# Patient Record
Sex: Female | Born: 1960 | Race: White | Hispanic: No | Marital: Single | Smoking: Never smoker
Health system: Southern US, Community
[De-identification: ages and names within clinical notes are randomized; demographics above are authoritative.]

## PROBLEM LIST (undated history)

## (undated) DIAGNOSIS — L811 Chloasma: Secondary | ICD-10-CM

## (undated) DIAGNOSIS — K5790 Diverticulosis of intestine, part unspecified, without perforation or abscess without bleeding: Secondary | ICD-10-CM

## (undated) DIAGNOSIS — M1990 Unspecified osteoarthritis, unspecified site: Secondary | ICD-10-CM

## (undated) DIAGNOSIS — F32A Depression, unspecified: Secondary | ICD-10-CM

## (undated) DIAGNOSIS — Z8619 Personal history of other infectious and parasitic diseases: Secondary | ICD-10-CM

## (undated) DIAGNOSIS — F329 Major depressive disorder, single episode, unspecified: Secondary | ICD-10-CM

## (undated) DIAGNOSIS — F419 Anxiety disorder, unspecified: Secondary | ICD-10-CM

## (undated) HISTORY — DX: Depression, unspecified: F32.A

## (undated) HISTORY — DX: Anxiety disorder, unspecified: F41.9

## (undated) HISTORY — DX: Diverticulosis of intestine, part unspecified, without perforation or abscess without bleeding: K57.90

## (undated) HISTORY — DX: Chloasma: L81.1

## (undated) HISTORY — DX: Major depressive disorder, single episode, unspecified: F32.9

## (undated) HISTORY — DX: Unspecified osteoarthritis, unspecified site: M19.90

## (undated) HISTORY — DX: Personal history of other infectious and parasitic diseases: Z86.19

---

## 1997-12-22 ENCOUNTER — Other Ambulatory Visit: Admission: RE | Admit: 1997-12-22 | Discharge: 1997-12-22 | Payer: Self-pay | Admitting: Obstetrics and Gynecology

## 2000-01-04 ENCOUNTER — Ambulatory Visit (HOSPITAL_COMMUNITY): Admission: RE | Admit: 2000-01-04 | Discharge: 2000-01-04 | Payer: Self-pay | Admitting: Obstetrics and Gynecology

## 2000-01-04 ENCOUNTER — Encounter: Payer: Self-pay | Admitting: Obstetrics and Gynecology

## 2001-02-27 ENCOUNTER — Other Ambulatory Visit: Admission: RE | Admit: 2001-02-27 | Discharge: 2001-02-27 | Payer: Self-pay | Admitting: *Deleted

## 2002-03-02 ENCOUNTER — Other Ambulatory Visit: Admission: RE | Admit: 2002-03-02 | Discharge: 2002-03-02 | Payer: Self-pay | Admitting: Obstetrics and Gynecology

## 2002-04-09 ENCOUNTER — Ambulatory Visit (HOSPITAL_COMMUNITY): Admission: RE | Admit: 2002-04-09 | Discharge: 2002-04-09 | Payer: Self-pay | Admitting: Obstetrics and Gynecology

## 2002-04-09 ENCOUNTER — Encounter: Payer: Self-pay | Admitting: Obstetrics and Gynecology

## 2003-01-19 ENCOUNTER — Ambulatory Visit (HOSPITAL_COMMUNITY): Admission: RE | Admit: 2003-01-19 | Discharge: 2003-01-19 | Payer: Self-pay | Admitting: Family Medicine

## 2003-03-14 ENCOUNTER — Other Ambulatory Visit: Admission: RE | Admit: 2003-03-14 | Discharge: 2003-03-14 | Payer: Self-pay | Admitting: Obstetrics and Gynecology

## 2004-04-20 ENCOUNTER — Ambulatory Visit (HOSPITAL_COMMUNITY): Admission: RE | Admit: 2004-04-20 | Discharge: 2004-04-20 | Payer: Self-pay | Admitting: Obstetrics and Gynecology

## 2005-04-25 ENCOUNTER — Other Ambulatory Visit: Admission: RE | Admit: 2005-04-25 | Discharge: 2005-04-25 | Payer: Self-pay | Admitting: Obstetrics & Gynecology

## 2005-05-27 ENCOUNTER — Encounter: Admission: RE | Admit: 2005-05-27 | Discharge: 2005-05-27 | Payer: Self-pay | Admitting: Obstetrics and Gynecology

## 2006-11-24 ENCOUNTER — Other Ambulatory Visit: Admission: RE | Admit: 2006-11-24 | Discharge: 2006-11-24 | Payer: Self-pay | Admitting: Obstetrics and Gynecology

## 2008-01-04 ENCOUNTER — Other Ambulatory Visit: Admission: RE | Admit: 2008-01-04 | Discharge: 2008-01-04 | Payer: Self-pay | Admitting: Obstetrics and Gynecology

## 2008-01-20 ENCOUNTER — Other Ambulatory Visit: Admission: RE | Admit: 2008-01-20 | Discharge: 2008-01-20 | Payer: Self-pay | Admitting: Obstetrics & Gynecology

## 2008-01-21 ENCOUNTER — Ambulatory Visit (HOSPITAL_COMMUNITY): Admission: RE | Admit: 2008-01-21 | Discharge: 2008-01-21 | Payer: Self-pay | Admitting: Obstetrics and Gynecology

## 2009-02-17 ENCOUNTER — Ambulatory Visit (HOSPITAL_COMMUNITY): Admission: RE | Admit: 2009-02-17 | Discharge: 2009-02-17 | Payer: Self-pay | Admitting: Obstetrics and Gynecology

## 2010-03-06 ENCOUNTER — Ambulatory Visit (HOSPITAL_COMMUNITY)
Admission: RE | Admit: 2010-03-06 | Discharge: 2010-03-06 | Payer: Self-pay | Source: Home / Self Care | Attending: Obstetrics and Gynecology | Admitting: Obstetrics and Gynecology

## 2010-03-11 DIAGNOSIS — K5790 Diverticulosis of intestine, part unspecified, without perforation or abscess without bleeding: Secondary | ICD-10-CM

## 2010-03-11 HISTORY — DX: Diverticulosis of intestine, part unspecified, without perforation or abscess without bleeding: K57.90

## 2010-09-07 HISTORY — PX: COLONOSCOPY: SHX174

## 2010-09-07 LAB — HM COLONOSCOPY

## 2011-02-18 ENCOUNTER — Other Ambulatory Visit: Payer: Self-pay | Admitting: Obstetrics and Gynecology

## 2011-02-18 DIAGNOSIS — Z1231 Encounter for screening mammogram for malignant neoplasm of breast: Secondary | ICD-10-CM

## 2011-03-12 DIAGNOSIS — M1990 Unspecified osteoarthritis, unspecified site: Secondary | ICD-10-CM

## 2011-03-12 HISTORY — DX: Unspecified osteoarthritis, unspecified site: M19.90

## 2011-03-25 ENCOUNTER — Ambulatory Visit (HOSPITAL_COMMUNITY)
Admission: RE | Admit: 2011-03-25 | Discharge: 2011-03-25 | Disposition: A | Discharge status: Home / Self Care | Payer: Federal, State, Local not specified - PPO | Source: Ambulatory Visit | Attending: Obstetrics and Gynecology | Admitting: Obstetrics and Gynecology

## 2011-03-25 DIAGNOSIS — Z1231 Encounter for screening mammogram for malignant neoplasm of breast: Secondary | ICD-10-CM | POA: Insufficient documentation

## 2011-06-10 HISTORY — PX: OTHER SURGICAL HISTORY: SHX169

## 2011-06-28 LAB — HM PAP SMEAR: HM Pap smear: NEGATIVE

## 2011-10-19 ENCOUNTER — Emergency Department (HOSPITAL_COMMUNITY)
Admission: EM | Admit: 2011-10-19 | Discharge: 2011-10-19 | Disposition: A | Discharge status: Home / Self Care | Payer: No Typology Code available for payment source | Attending: Emergency Medicine | Admitting: Emergency Medicine

## 2011-10-19 ENCOUNTER — Emergency Department (HOSPITAL_COMMUNITY): Payer: No Typology Code available for payment source

## 2011-10-19 ENCOUNTER — Encounter (HOSPITAL_COMMUNITY): Payer: Self-pay | Admitting: *Deleted

## 2011-10-19 DIAGNOSIS — Z23 Encounter for immunization: Secondary | ICD-10-CM | POA: Insufficient documentation

## 2011-10-19 DIAGNOSIS — M542 Cervicalgia: Secondary | ICD-10-CM | POA: Insufficient documentation

## 2011-10-19 DIAGNOSIS — M25519 Pain in unspecified shoulder: Secondary | ICD-10-CM | POA: Insufficient documentation

## 2011-10-19 DIAGNOSIS — R112 Nausea with vomiting, unspecified: Secondary | ICD-10-CM | POA: Insufficient documentation

## 2011-10-19 DIAGNOSIS — Y92410 Unspecified street and highway as the place of occurrence of the external cause: Secondary | ICD-10-CM | POA: Insufficient documentation

## 2011-10-19 DIAGNOSIS — T148XXA Other injury of unspecified body region, initial encounter: Secondary | ICD-10-CM

## 2011-10-19 LAB — POCT PREGNANCY, URINE: Preg Test, Ur: NEGATIVE

## 2011-10-19 MED ORDER — IBUPROFEN 800 MG PO TABS: 800.0000 mg | ORAL_TABLET | Freq: Three times a day (TID) | ORAL | Status: AC

## 2011-10-19 MED ORDER — ONDANSETRON HCL 4 MG/2ML IJ SOLN
4.0000 mg | Freq: Once | INTRAMUSCULAR | Status: AC
  Administered 2011-10-19: 4 mg via INTRAVENOUS
  Filled 2011-10-19: qty 2

## 2011-10-19 MED ORDER — HYDROCODONE-ACETAMINOPHEN 5-500 MG PO TABS: 2.0000 | ORAL_TABLET | Freq: Four times a day (QID) | ORAL | Status: AC | PRN

## 2011-10-19 MED ORDER — CYCLOBENZAPRINE HCL 10 MG PO TABS: 10.0000 mg | ORAL_TABLET | Freq: Two times a day (BID) | ORAL | Status: AC | PRN

## 2011-10-19 MED ORDER — TETANUS-DIPHTH-ACELL PERTUSSIS 5-2.5-18.5 LF-MCG/0.5 IM SUSP
0.5000 mL | Freq: Once | INTRAMUSCULAR | Status: AC
  Administered 2011-10-19: 0.5 mL via INTRAMUSCULAR
  Filled 2011-10-19: qty 0.5

## 2011-10-19 MED ORDER — IOHEXOL 300 MG/ML  SOLN: 100.0000 mL | Freq: Once | INTRAMUSCULAR | Status: DC | PRN

## 2011-10-19 MED ORDER — FENTANYL CITRATE 0.05 MG/ML IJ SOLN
25.0000 ug | Freq: Once | INTRAMUSCULAR | Status: AC
  Administered 2011-10-19: 25 ug via INTRAVENOUS
  Filled 2011-10-19: qty 2

## 2011-10-19 MED ORDER — FENTANYL CITRATE 0.05 MG/ML IJ SOLN
50.0000 ug | Freq: Once | INTRAMUSCULAR | Status: AC
  Administered 2011-10-19: 50 ug via INTRAVENOUS
  Filled 2011-10-19: qty 2

## 2011-10-19 MED ORDER — SODIUM CHLORIDE 0.9 % IV BOLUS (SEPSIS)
1000.0000 mL | Freq: Once | INTRAVENOUS | Status: AC
  Administered 2011-10-19: 1000 mL via INTRAVENOUS

## 2011-10-19 MED ORDER — FENTANYL CITRATE 0.05 MG/ML IJ SOLN: 50.0000 ug | Freq: Once | INTRAMUSCULAR | Status: DC
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
## 2011-10-19 NOTE — ED Notes (Signed)
Returned from xray

## 2011-10-19 NOTE — ED Notes (Signed)
To x-ray

## 2011-10-19 NOTE — ED Notes (Addendum)
Justin Mend (friend) - [phone]. To call when pt is ready for discharge
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
## 2011-10-19 NOTE — ED Notes (Signed)
Dr. Dierdre Highman updated re: need for pain med. And verbal order received ffor fentanyl 50 mcg IV now

## 2012-03-17 ENCOUNTER — Other Ambulatory Visit: Payer: Self-pay | Admitting: Obstetrics and Gynecology

## 2012-03-17 ENCOUNTER — Ambulatory Visit (INDEPENDENT_AMBULATORY_CARE_PROVIDER_SITE_OTHER): Payer: Federal, State, Local not specified - PPO | Admitting: Emergency Medicine

## 2012-03-17 VITALS — BP 110/68 | HR 78 | Temp 98.4°F | Resp 16 | Ht 67.0 in | Wt 140.0 lb

## 2012-03-17 DIAGNOSIS — J029 Acute pharyngitis, unspecified: Secondary | ICD-10-CM

## 2012-03-17 DIAGNOSIS — J018 Other acute sinusitis: Secondary | ICD-10-CM

## 2012-03-17 DIAGNOSIS — Z1231 Encounter for screening mammogram for malignant neoplasm of breast: Secondary | ICD-10-CM

## 2012-03-17 MED ORDER — PSEUDOEPHEDRINE-GUAIFENESIN ER 60-600 MG PO TB12: 1.0000 | ORAL_TABLET | Freq: Two times a day (BID) | ORAL | Status: DC

## 2012-03-17 MED ORDER — AMOXICILLIN-POT CLAVULANATE 875-125 MG PO TABS: 1.0000 | ORAL_TABLET | Freq: Two times a day (BID) | ORAL | Status: DC

## 2012-03-17 NOTE — Progress Notes (Signed)
[facility] [address], [place] [address] [phone]  Date:  03/17/2012   Name:  April Vaughn   DOB:  June 17, 1960   MRN:  [id number]  PCP:  Default, Provider, MD    Chief Complaint: Sore Throat   History of Present Illness:  April Vaughn is a 52 y.o. very pleasant female patient who presents with the following:  Sudden onset of fever and sore throat associated with chills yesterday.  Was in a group of children Friday night.  No nausea or vomiting.  Has some nasal congestion and post nasal drip that is purulent in color.  Non productive occasional cough.  No wheezing or shortness of breath.  There is no problem list on file for this patient.   History reviewed. No pertinent past medical history.  History reviewed. No pertinent past surgical history.  History  Substance Use Topics  . Smoking status: Never Smoker   . Smokeless tobacco: Not on file  . Alcohol Use: Yes    Family History  Problem Relation Age of Onset  . Heart disease Mother     No Known Allergies  Medication list has been reviewed and updated.  Current Outpatient Prescriptions on File Prior to Visit  Medication Sig Dispense Refill  . Pseudoephedrine-Ibuprofen (ADVIL COLD/SINUS PO) Take 1 tablet by mouth every 6 (six) hours as needed. For sinus headache or congestion        Review of Systems:  As per HPI, otherwise negative.    Physical Examination: Filed Vitals:   03/17/12 1036  BP: 110/68  Pulse: 78  Temp: 98.4 F (36.9 C)  Resp: 16   Filed Vitals:   03/17/12 1036  Height: 5\' 7"  (1.702 m)  Weight: 140 lb (63.504 kg)   Body mass index is 21.93 kg/(m^2). Ideal Body Weight: Weight in (lb) to have BMI = 25: 159.3   GEN: WDWN, NAD, Non-toxic, A & O x 3 HEENT: Atraumatic, Normocephalic. Neck supple. No masses, No LAD.  Oropharynx erythematous Ears and Nose: No external deformity.  TM negative.  Green nasal drainage CV: RRR, No M/G/R. No JVD. No thrill. No  extra heart sounds. PULM: CTA B, no wheezes, crackles, rhonchi. No retractions. No resp. distress. No accessory muscle use. ABD: S, NT, ND, +BS. No rebound. No HSM. EXTR: No c/c/e NEURO Normal gait.  PSYCH: Normally interactive. Conversant. Not depressed or anxious appearing.  Calm demeanor.    Assessment and Plan: Sinusitis Pharyngitis augmentin mucinex Follow up as needed  Carmelina Dane, MD

## 2012-03-26 ENCOUNTER — Ambulatory Visit (HOSPITAL_COMMUNITY)
Admission: RE | Admit: 2012-03-26 | Discharge: 2012-03-26 | Disposition: A | Discharge status: Home / Self Care | Payer: Federal, State, Local not specified - PPO | Source: Ambulatory Visit | Attending: Obstetrics and Gynecology | Admitting: Obstetrics and Gynecology

## 2012-03-26 DIAGNOSIS — Z1231 Encounter for screening mammogram for malignant neoplasm of breast: Secondary | ICD-10-CM | POA: Insufficient documentation

## 2012-03-26 LAB — HM MAMMOGRAPHY: HM Mammogram: NEGATIVE

## 2012-06-17 ENCOUNTER — Encounter (HOSPITAL_COMMUNITY): Payer: Self-pay | Admitting: *Deleted

## 2012-06-17 ENCOUNTER — Emergency Department (HOSPITAL_COMMUNITY)
Admission: EM | Admit: 2012-06-17 | Discharge: 2012-06-17 | Disposition: A | Discharge status: Home / Self Care | Payer: Federal, State, Local not specified - PPO | Attending: Emergency Medicine | Admitting: Emergency Medicine

## 2012-06-17 ENCOUNTER — Emergency Department (HOSPITAL_COMMUNITY): Payer: Federal, State, Local not specified - PPO

## 2012-06-17 DIAGNOSIS — R42 Dizziness and giddiness: Secondary | ICD-10-CM | POA: Insufficient documentation

## 2012-06-17 DIAGNOSIS — R209 Unspecified disturbances of skin sensation: Secondary | ICD-10-CM | POA: Insufficient documentation

## 2012-06-17 DIAGNOSIS — R0789 Other chest pain: Secondary | ICD-10-CM | POA: Insufficient documentation

## 2012-06-17 DIAGNOSIS — R079 Chest pain, unspecified: Secondary | ICD-10-CM

## 2012-06-17 LAB — BASIC METABOLIC PANEL
BUN: 15 mg/dL (ref 6–23)
Creatinine, Ser: 0.7 mg/dL (ref 0.50–1.10)
GFR calc non Af Amer: >90 mL/min (ref >90)
Glucose, Bld: 101 mg/dL — ABNORMAL HIGH (ref 70–99)
Potassium: 3.9 mEq/L (ref 3.5–5.1)

## 2012-06-17 LAB — CBC
HCT: 39.6 % (ref 36.0–46.0)
Hemoglobin: 13.5 g/dL (ref 12.0–15.0)
MCHC: 34.1 g/dL (ref 30.0–36.0)
MCV: 93.2 fL (ref 78.0–100.0)
RDW: 13.1 % (ref 11.5–15.5)

## 2012-06-17 LAB — POCT I-STAT TROPONIN I: Troponin i, poc: 0 ng/mL (ref 0.00–0.08)

## 2012-06-17 MED ORDER — ASPIRIN 81 MG PO CHEW
324.0000 mg | CHEWABLE_TABLET | Freq: Once | ORAL | Status: AC
  Administered 2012-06-17: 324 mg via ORAL
  Filled 2012-06-17: qty 4

## 2012-06-17 NOTE — ED Provider Notes (Signed)
History     CSN: [id number]  Arrival date & time 06/17/12  1004   First MD Initiated Contact with Patient 06/17/12 1015      Chief Complaint  Patient presents with  . Shortness of Breath    (Consider location/radiation/quality/duration/timing/severity/associated sxs/prior treatment) HPI April Vaughn is a 52 y.o. female who presents to ED with complaint of Shortness of breath. States she was at work yesterday when developed shortness of breath and pressure between her shoulder blades. States symptoms went away on its own and returned again last night while sitting down watching TV. States again resolved. This morning states woke up with similar shortness of breath and states "right arm feels funny, tingling." States she has no medical problems. She does take hormone replacement therapy. States normal cholesterol, no hx of diabetes or htn, does not smoke. States does have sister who is few years older than her and who has cardiac stents due to CAD. States took 81 mg aspirin yesterday, none today. No recent travel or surgeries. No current chest pain.    History reviewed. No pertinent past medical history.  History reviewed. No pertinent past surgical history.  Family History  Problem Relation Age of Onset  . Heart disease Mother     History  Substance Use Topics  . Smoking status: Never Smoker   . Smokeless tobacco: Not on file  . Alcohol Use: Yes    OB History   Grav Para Term Preterm Abortions TAB SAB Ect Mult Living                  Review of Systems  Constitutional: Negative for fever and chills.  Respiratory: Positive for chest tightness and shortness of breath. Negative for cough and wheezing.   Cardiovascular: Negative.   Gastrointestinal: Negative.   Skin: Negative.   Neurological: Positive for numbness. Negative for weakness.  All other systems reviewed and are negative.    Allergies  Review of patient's allergies indicates no known allergies.  Home  Medications   Current Outpatient Rx  Name  Route  Sig  Dispense  Refill  . amoxicillin-clavulanate (AUGMENTIN) 875-125 MG per tablet   Oral   Take 1 tablet by mouth 2 (two) times daily.   20 tablet   0   . pseudoephedrine-guaifenesin (MUCINEX D) 60-600 MG per tablet   Oral   Take 1 tablet by mouth every 12 (twelve) hours.   18 tablet   0   . Pseudoephedrine-Ibuprofen (ADVIL COLD/SINUS PO)   Oral   Take 1 tablet by mouth every 6 (six) hours as needed. For sinus headache or congestion           BP 123/78  Pulse 89  Temp(Src) 98.8 F (37.1 C)  Resp 18  SpO2 98%  Physical Exam  Nursing note and vitals reviewed. Constitutional: She appears well-developed and well-nourished. No distress.  Eyes: Conjunctivae are normal.  Neck: Neck supple.  Cardiovascular: Normal rate, regular rhythm and normal heart sounds.   Pulmonary/Chest: Effort normal and breath sounds normal. No respiratory distress. She has no wheezes. She has no rales.  Musculoskeletal: She exhibits no edema.  Neurological: She is alert.  Skin: Skin is warm and dry.    ED Course  Procedures (including critical care time)   Date: 06/17/2012  Rate: 70  Rhythm: normal sinus rhythm  QRS Axis: left  Intervals: normal  ST/T Wave abnormalities: normal  Conduction Disutrbances:left anterior fascicular block  Narrative Interpretation:   Old EKG Reviewed: none  available  Results for orders placed during the hospital encounter of 06/17/12  CBC      Result Value Range   WBC 6.4  4.0 - 10.5 K/uL   RBC 4.25  3.87 - 5.11 MIL/uL   Hemoglobin 13.5  12.0 - 15.0 g/dL   HCT 16.1  09.6 - 04.5 %   MCV 93.2  78.0 - 100.0 fL   MCH 31.8  26.0 - 34.0 pg   MCHC 34.1  30.0 - 36.0 g/dL   RDW 40.9  81.1 - 91.4 %   Platelets 329  150 - 400 K/uL  BASIC METABOLIC PANEL      Result Value Range   Sodium 138  135 - 145 mEq/L   Potassium 3.9  3.5 - 5.1 mEq/L   Chloride 103  96 - 112 mEq/L   CO2 24  19 - 32 mEq/L   Glucose, Bld  101 (*) 70 - 99 mg/dL   BUN 15  6 - 23 mg/dL   Creatinine, Ser 7.82  0.50 - 1.10 mg/dL   Calcium 9.3  8.4 - 95.6 mg/dL   GFR calc non Af Amer >90  >90 mL/min   GFR calc Af Amer >90  >90 mL/min  D-DIMER, QUANTITATIVE      Result Value Range   D-Dimer, Quant 0.34  0.00 - 0.48 ug/mL-FEU  POCT I-STAT TROPONIN I      Result Value Range   Troponin i, poc 0.01  0.00 - 0.08 ng/mL   Comment 3           POCT I-STAT TROPONIN I      Result Value Range   Troponin i, poc 0.00  0.00 - 0.08 ng/mL   Comment 3            Dg Chest 2 View  06/17/2012  *RADIOLOGY REPORT*  Clinical Data: Shortness of breath.  CHEST - 2 VIEW  Comparison: Chest x-ray 10/19/2011.  Findings: Lung volumes are normal.  No consolidative airspace disease.  No pleural effusions.  No pneumothorax.  No pulmonary nodule or mass noted.  Pulmonary vasculature and the cardiomediastinal silhouette are within normal limits.  IMPRESSION: 1. No radiographic evidence of acute cardiopulmonary disease.   Original Report Authenticated By: Trudie Reed, M.D.        1. Chest pain       MDM  Pt with two episodes yesterday and one today of shortness of breath, dizziness. Pt in NAD. ECG, troponin x2 negative. CXR negative. D dimer negative. Pt is low risk for CAD. No hx. I spoke with Dr. Sharyn Lull, cardiology, who will follow up with pt in the office tomorrow and set up stress test. Pt agreed with the plan. Currently symptom free. Will d/chome with close follow up.   Filed Vitals:   06/17/12 1023 06/17/12 1115 06/17/12 1343  BP: 123/78  127/75  Pulse: 89 62 58  Temp: 98.8 F (37.1 C)    Resp: 18 12   SpO2: 98% 97% 100%          Hazen Brumett A Vannie Hochstetler, PA-C 06/17/12 2057

## 2012-06-17 NOTE — ED Notes (Signed)
Pt reports sob since yesterday. Feels as if she has to breath rapidly to get sufficient oxygenation. Denies chest pain, reports back and neck pain. Denies n/v, diaphoresis. Family Hx cardiopulmonary issues. Pt appears in NAD, 98% O2 RA. Able to ambulate to room from triage with no worsening sob. Reports it started yesterday while sitting at work, no exertion and happened again last night while lying down, again no exertion.

## 2012-06-18 NOTE — ED Provider Notes (Signed)
  Medical screening examination/treatment/procedure(s) were performed by non-physician practitioner and as supervising physician I was immediately available for consultation/collaboration.    Zita Ozimek, MD 06/18/12 1548 

## 2012-07-01 ENCOUNTER — Encounter: Payer: Self-pay | Admitting: *Deleted

## 2012-07-03 ENCOUNTER — Ambulatory Visit (INDEPENDENT_AMBULATORY_CARE_PROVIDER_SITE_OTHER): Payer: Federal, State, Local not specified - PPO | Admitting: Nurse Practitioner

## 2012-07-03 ENCOUNTER — Encounter: Payer: Self-pay | Admitting: Nurse Practitioner

## 2012-07-03 VITALS — BP 114/60 | HR 72 | Ht 66.0 in | Wt 140.0 lb

## 2012-07-03 DIAGNOSIS — B009 Herpesviral infection, unspecified: Secondary | ICD-10-CM

## 2012-07-03 DIAGNOSIS — R002 Palpitations: Secondary | ICD-10-CM

## 2012-07-03 DIAGNOSIS — A609 Anogenital herpesviral infection, unspecified: Secondary | ICD-10-CM

## 2012-07-03 DIAGNOSIS — Z0000 Encounter for general adult medical examination without abnormal findings: Secondary | ICD-10-CM

## 2012-07-03 DIAGNOSIS — Z780 Asymptomatic menopausal state: Secondary | ICD-10-CM

## 2012-07-03 DIAGNOSIS — Z01419 Encounter for gynecological examination (general) (routine) without abnormal findings: Secondary | ICD-10-CM

## 2012-07-03 LAB — POCT URINALYSIS DIPSTICK
Spec Grav, UA: 1.015
Urobilinogen, UA: NEGATIVE

## 2012-07-03 MED ORDER — PROGESTERONE MICRONIZED 200 MG PO CAPS: 200.0000 mg | ORAL_CAPSULE | Freq: Every day | ORAL | Status: DC

## 2012-07-03 MED ORDER — ESTRADIOL 0.5 MG PO TABS: 0.5000 mg | ORAL_TABLET | Freq: Every day | ORAL | Status: DC

## 2012-07-03 MED ORDER — VALACYCLOVIR HCL 1 G PO TABS: 1000.0000 mg | ORAL_TABLET | ORAL | Status: DC | PRN
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
## 2012-07-03 NOTE — Patient Instructions (Addendum)

## 2012-07-04 LAB — THYROID PANEL WITH TSH: TSH: 0.756 u[IU]/mL (ref 0.350–4.500)

## 2012-07-05 NOTE — Progress Notes (Signed)
Encounter reviewed by Dr. Brook Silva.  

## 2012-07-06 ENCOUNTER — Telehealth: Payer: Self-pay | Admitting: Nurse Practitioner

## 2012-07-06 NOTE — Progress Notes (Signed)
LVM for pt to call back for results.

## 2012-07-06 NOTE — Telephone Encounter (Signed)
LVM (x2) for pt to return my call in regards to results.

## 2012-07-06 NOTE — Telephone Encounter (Signed)
Message copied by Osie Bond on Mon Jul 06, 2012  3:02 PM ------      Message from: Ria Comment R      Created: Mon Jul 06, 2012  9:13 AM       Let patient know that TSH and panel is normal and most likely not the cause of palpitations. ------

## 2012-07-06 NOTE — Telephone Encounter (Signed)
Pt is aware of lab results. Pt states palpitations are occuring again today and would like Rx  For Xanax. Please advise.

## 2012-07-06 NOTE — Telephone Encounter (Signed)
Patient returning Tiffany's call.

## 2012-07-06 NOTE — Telephone Encounter (Signed)
Xanax is not a med we give.  She saw PCP and cardiologist for this problem, so they need to prescribe this med.

## 2012-07-07 NOTE — Telephone Encounter (Signed)
Pt advised to ask PCP for Xanax Rx. Pt agreeable and will make contact with PCP.

## 2012-07-23 ENCOUNTER — Encounter: Payer: Self-pay | Admitting: Internal Medicine

## 2012-07-23 ENCOUNTER — Ambulatory Visit (INDEPENDENT_AMBULATORY_CARE_PROVIDER_SITE_OTHER): Payer: Federal, State, Local not specified - PPO | Admitting: Internal Medicine

## 2012-07-23 ENCOUNTER — Telehealth: Payer: Self-pay | Admitting: *Deleted

## 2012-07-23 VITALS — BP 104/82 | HR 75 | Temp 99.1°F | Ht 66.0 in | Wt 138.6 lb

## 2012-07-23 DIAGNOSIS — F411 Generalized anxiety disorder: Secondary | ICD-10-CM

## 2012-07-23 DIAGNOSIS — F32A Depression, unspecified: Secondary | ICD-10-CM

## 2012-07-23 DIAGNOSIS — F419 Anxiety disorder, unspecified: Secondary | ICD-10-CM

## 2012-07-23 DIAGNOSIS — F329 Major depressive disorder, single episode, unspecified: Secondary | ICD-10-CM

## 2012-07-23 MED ORDER — ALPRAZOLAM 0.5 MG PO TABS: 0.5000 mg | ORAL_TABLET | Freq: Two times a day (BID) | ORAL | Status: DC | PRN

## 2012-07-23 MED ORDER — ESCITALOPRAM OXALATE 10 MG PO TABS: 10.0000 mg | ORAL_TABLET | Freq: Every day | ORAL | Status: DC

## 2012-07-23 NOTE — Telephone Encounter (Signed)
Received call from Kim/pharmacist @ [facility]. She states they received script for alprazolam, but pt has been getting med from Dr. Sharyn Lull & is 15 days early. Last filled for # 60. Does md still want fill...Raechel Chute

## 2012-07-23 NOTE — Progress Notes (Signed)
Subjective:    Patient ID: April Vaughn, female    DOB: 01/12/1961, 52 y.o.   MRN: [id number]  HPI New patient to me today -  planned CPX to establish an August 2014, work in today because of acute issues  Patient presents to the office today complaining of "depression".  Patient states that her friends of 20 years have recommended that she come in for depression and talk about the potential use of an antidepressant medication.  Patient states that she has had a dysphoric mood since January of 2014 when she and her girlfriend broke up.  Patient states that five years ago she also had a severe break-up and the loss of both parents and thinks that she did not appropriately grieve at this time.  Patient began counseling in March with Jonetta Osgood.  She has not discussed antidepressant medication with her therapist as she thinks that she would not be accepting of this.  Patient states that her dysphoric mood is associated with decrease in concentration, difficulty falling asleep and staying asleep, decreased appetite and general irritability.  Patient denies suicidal ideation and homicidal ideations.  She does not have access to firearms at home.    Patient also reports that she has had several episodes of anxiety since January as well.  She thought she was having a heart attack in April 2014, and went to [facility] ED for chest pain.  Chest pain at that time was associated with shortness of breath and numbness of the hands.  Patient was referred to Dr. Sharyn Lull in cards for stress test which was normal.  He diagnosed her at that time with anxiety and prescribed her Xanax.  She has used this as a rescue medication twice.  She feels that it is very helpful.   Past Medical History  Diagnosis Date  . Chloasma   . Diverticulosis 2012  . 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 History of PCR DNA positive for HSV1   . Anxiety   . Depression    Family History  Problem Relation Age of Onset  . Heart  disease Mother   . Hypertension Father   . Heart disease Father   . Kidney failure Father   . Colon cancer Maternal Grandfather   . Heart disease Sister   . Crohn's disease Sister      Review of Systems  Constitutional: Positive for appetite change. Negative for fever, chills and fatigue.  Respiratory: Negative for chest tightness and shortness of breath.   Cardiovascular: Negative for chest pain and palpitations.  Neurological: Negative for weakness and numbness.  Psychiatric/Behavioral: Positive for sleep disturbance, dysphoric mood, decreased concentration and agitation. Negative for suicidal ideas and self-injury. The patient is nervous/anxious.   All other systems reviewed and are negative.       Objective:   Physical Exam  Nursing note and vitals reviewed. Constitutional: She is oriented to person, place, and time. She appears well-developed and well-nourished. No distress.  HENT:  Head: Normocephalic and atraumatic.  Eyes: Conjunctivae are normal. No scleral icterus.  Neck: Normal range of motion. Neck supple.  Cardiovascular: Normal rate, regular rhythm and normal heart sounds.  Exam reveals no gallop and no friction rub.   No murmur heard. Pulmonary/Chest: Effort normal and breath sounds normal. No respiratory distress. She has no wheezes. She has no rales. She exhibits no tenderness.  Musculoskeletal: Normal range of motion.  Neurological: She is alert and oriented to person, place, and time.  Skin:  Skin is warm and dry. She is not diaphoretic.  Psychiatric: Her behavior is normal. Judgment and thought content normal. Cognition and memory are normal.  Mildly anxious mood, normal affect   Filed Vitals:   07/23/12 0915  BP: 104/82  Pulse: 75  Temp: 99.1 F (37.3 C)  TempSrc: Oral  Height: 5\' 6"  (1.676 m)  Weight: 138 lb 9.6 oz (62.869 kg)  SpO2: 98%   Lab Results  Component Value Date   WBC 6.4 06/17/2012   HGB 13.5 06/17/2012   HCT 39.6 06/17/2012   PLT 329  06/17/2012   GLUCOSE 101* 06/17/2012   NA 138 06/17/2012   K 3.9 06/17/2012   CL 103 06/17/2012   CREATININE 0.70 06/17/2012   BUN 15 06/17/2012   CO2 24 06/17/2012   TSH 0.756 07/03/2012      Assessment & Plan:  Patient presents to the office today complaining of depression x 5 months:  1.  Depression:  Risks and benefits of antidepressant use were discussed and patient is amenable to trying antidepressant therapy.  Will prescribe Lexapro 10 mg PO QD.  Side effects were discussed.  Patient given strict return precautions and will return in 6 weeks, or will call sooner if she is experiencing problems.  Continue counseling weekly as ongoing.  2  Anxiety:  Patient to continue using Xanax as rescue medications for panic attacks prn.  Patient to return in 6 weeks for recheck of depression.  Will call sooner if problems.

## 2012-07-23 NOTE — Patient Instructions (Signed)
It was good to see you today. We have reviewed your prior records including labs and tests today Start Lexapro once daily for depression and anxiety symptoms. Okay to use Xanax as needed for panic attacks if needed Your prescription(s) have been submitted to your pharmacy. Please take as directed and contact our office if you believe you are having problem(s) with the medication(s). Please schedule followup in 6 weeks, call sooner if problems. Continue working with [facility] for counseling

## 2012-07-23 NOTE — Telephone Encounter (Signed)
Notified Kim with md response.../lmb 

## 2012-07-23 NOTE — Telephone Encounter (Signed)
Pharmacy does not need to fill now, but keep on file  Dr Sharyn Lull (cards) wrote initial rx and i will provide refills as needed going forward -  Thanks!

## 2012-10-19 ENCOUNTER — Encounter: Payer: Self-pay | Admitting: Internal Medicine

## 2012-10-19 ENCOUNTER — Ambulatory Visit (INDEPENDENT_AMBULATORY_CARE_PROVIDER_SITE_OTHER): Payer: Federal, State, Local not specified - PPO | Admitting: Internal Medicine

## 2012-10-19 VITALS — BP 110/78 | HR 68 | Temp 98.0°F | Wt 139.0 lb

## 2012-10-19 DIAGNOSIS — F329 Major depressive disorder, single episode, unspecified: Secondary | ICD-10-CM

## 2012-10-19 DIAGNOSIS — F32A Depression, unspecified: Secondary | ICD-10-CM

## 2012-10-19 DIAGNOSIS — Z0000 Encounter for general adult medical examination without abnormal findings: Secondary | ICD-10-CM

## 2012-10-19 MED ORDER — ESCITALOPRAM OXALATE 10 MG PO TABS: 10.0000 mg | ORAL_TABLET | Freq: Every day | ORAL | Status: DC

## 2012-10-19 NOTE — Patient Instructions (Signed)
It was good to see you today. We have reviewed your prior records including labs and tests today Health Maintenance reviewed - all recommended immunizations and age-appropriate screenings are up-to-date. Test(s) ordered today. Your results will be released to MyChart (or called to you) after review, usually within 72hours after test completion. If any changes need to be made, you will be notified at that same time. Medications reviewed and updated, no changes recommended at this time. Refill on medication(s) as discussed today. Please schedule followup in 6 months on depression symptoms and medications, call sooner if problems.  Health Maintenance, Females A healthy lifestyle and preventative care can promote health and wellness.  Maintain regular health, dental, and eye exams.  Eat a healthy diet. Foods like vegetables, fruits, whole grains, low-fat dairy products, and lean protein foods contain the nutrients you need without too many calories. Decrease your intake of foods high in solid fats, added sugars, and salt. Get information about a proper diet from your caregiver, if necessary.  Regular physical exercise is one of the most important things you can do for your health. Most adults should get at least 150 minutes of moderate-intensity exercise (any activity that increases your heart rate and causes you to sweat) each week. In addition, most adults need muscle-strengthening exercises on 2 or more days a week.   Maintain a healthy weight. The body mass index (BMI) is a screening tool to identify possible weight problems. It provides an estimate of body fat based on height and weight. Your caregiver can help determine your BMI, and can help you achieve or maintain a healthy weight. For adults 20 years and older:  A BMI below 18.5 is considered underweight.  A BMI of 18.5 to 24.9 is normal.  A BMI of 25 to 29.9 is considered overweight.  A BMI of 30 and above is considered  obese.  Maintain normal blood lipids and cholesterol by exercising and minimizing your intake of saturated fat. Eat a balanced diet with plenty of fruits and vegetables. Blood tests for lipids and cholesterol should begin at age 31 and be repeated every 5 years. If your lipid or cholesterol levels are high, you are over 50, or you are a high risk for heart disease, you may need your cholesterol levels checked more frequently.Ongoing high lipid and cholesterol levels should be treated with medicines if diet and exercise are not effective.  If you smoke, find out from your caregiver how to quit. If you do not use tobacco, do not start.  If you are pregnant, do not drink alcohol. If you are breastfeeding, be very cautious about drinking alcohol. If you are not pregnant and choose to drink alcohol, do not exceed 1 drink per day. One drink is considered to be 12 ounces (355 mL) of beer, 5 ounces (148 mL) of wine, or 1.5 ounces (44 mL) of liquor.  Avoid use of street drugs. Do not share needles with anyone. Ask for help if you need support or instructions about stopping the use of drugs.  High blood pressure causes heart disease and increases the risk of stroke. Blood pressure should be checked at least every 1 to 2 years. Ongoing high blood pressure should be treated with medicines, if weight loss and exercise are not effective.  If you are 30 to 51 years old, ask your caregiver if you should take aspirin to prevent strokes.  Diabetes screening involves taking a blood sample to check your fasting blood sugar level. This should be  done once every 3 years, after age 60, if you are within normal weight and without risk factors for diabetes. Testing should be considered at a younger age or be carried out more frequently if you are overweight and have at least 1 risk factor for diabetes.  Breast cancer screening is essential preventative care for women. You should practice "breast self-awareness." This means  understanding the normal appearance and feel of your breasts and may include breast self-examination. Any changes detected, no matter how small, should be reported to a caregiver. Women in their 41s and 30s should have a clinical breast exam (CBE) by a caregiver as part of a regular health exam every 1 to 3 years. After age 78, women should have a CBE every year. Starting at age 31, women should consider having a mammogram (breast X-ray) every year. Women who have a family history of breast cancer should talk to their caregiver about genetic screening. Women at a high risk of breast cancer should talk to their caregiver about having an MRI and a mammogram every year.  The Pap test is a screening test for cervical cancer. Women should have a Pap test starting at age 52. Between ages 39 and 46, Pap tests should be repeated every 2 years. Beginning at age 71, you should have a Pap test every 3 years as long as the past 3 Pap tests have been normal. If you had a hysterectomy for a problem that was not cancer or a condition that could lead to cancer, then you no longer need Pap tests. If you are between ages 22 and 1, and you have had normal Pap tests going back 10 years, you no longer need Pap tests. If you have had past treatment for cervical cancer or a condition that could lead to cancer, you need Pap tests and screening for cancer for at least 20 years after your treatment. If Pap tests have been discontinued, risk factors (such as a new sexual partner) need to be reassessed to determine if screening should be resumed. Some women have medical problems that increase the chance of getting cervical cancer. In these cases, your caregiver may recommend more frequent screening and Pap tests.  The human papillomavirus (HPV) test is an additional test that may be used for cervical cancer screening. The HPV test looks for the virus that can cause the cell changes on the cervix. The cells collected during the Pap test  can be tested for HPV. The HPV test could be used to screen women aged 53 years and older, and should be used in women of any age who have unclear Pap test results. After the age of 56, women should have HPV testing at the same frequency as a Pap test.  Colorectal cancer can be detected and often prevented. Most routine colorectal cancer screening begins at the age of 76 and continues through age 18. However, your caregiver may recommend screening at an earlier age if you have risk factors for colon cancer. On a yearly basis, your caregiver may provide home test kits to check for hidden blood in the stool. Use of a small camera at the end of a tube, to directly examine the colon (sigmoidoscopy or colonoscopy), can detect the earliest forms of colorectal cancer. Talk to your caregiver about this at age 53, when routine screening begins. Direct examination of the colon should be repeated every 5 to 10 years through age 17, unless early forms of pre-cancerous polyps or small growths are found.  Hepatitis C blood testing is recommended for all people born from [birth year] through 1964 and any individual with known risks for hepatitis C.  Practice safe sex. Use condoms and avoid high-risk sexual practices to reduce the spread of sexually transmitted infections (STIs). Sexually active women aged 77 and younger should be checked for Chlamydia, which is a common sexually transmitted infection. Older women with new or multiple partners should also be tested for Chlamydia. Testing for other STIs is recommended if you are sexually active and at increased risk.  Osteoporosis is a disease in which the bones lose minerals and strength with aging. This can result in serious bone fractures. The risk of osteoporosis can be identified using a bone density scan. Women ages 23 and over and women at risk for fractures or osteoporosis should discuss screening with their caregivers. Ask your caregiver whether you should be taking a  calcium supplement or vitamin D to reduce the rate of osteoporosis.  Menopause can be associated with physical symptoms and risks. Hormone replacement therapy is available to decrease symptoms and risks. You should talk to your caregiver about whether hormone replacement therapy is right for you.  Use sunscreen with a sun protection factor (SPF) of 30 or greater. Apply sunscreen liberally and repeatedly throughout the day. You should seek shade when your shadow is shorter than you. Protect yourself by wearing long sleeves, pants, a wide-brimmed hat, and sunglasses year round, whenever you are outdoors.  Notify your caregiver of new moles or changes in moles, especially if there is a change in shape or color. Also notify your caregiver if a mole is larger than the size of a pencil eraser.  Stay current with your immunizations. Document Released: 09/10/2010 Document Revised: 05/20/2011 Document Reviewed: 09/10/2010 [facility] Patient Information 2014 [place], [place].

## 2012-10-19 NOTE — Assessment & Plan Note (Signed)
07/2012 began low dose Lexapro 10 mg qd for symptoms present >15months Much improved -  Consider wean to 5mg  daily if remains stable also with continue counseling biweekly as ongoing.

## 2012-10-19 NOTE — Progress Notes (Signed)
Subjective:    Patient ID: April Vaughn, female    DOB: [birth year], 52 y.o.   MRN: [id number]  HPI  patient is here today for annual physical. Patient feels well and has no complaints.  Also reviewed chronic medical issues, namely medical treatment of depression and anxiety symptoms  Past Medical History  Diagnosis Date  . Chloasma   . Diverticulosis 2012  . 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 History of PCR DNA positive for HSV1   . Anxiety   . Depression    Family History  Problem Relation Age of Onset  . Heart disease Mother   . Hypertension Father   . Heart disease Father   . Kidney failure Father   . Colon cancer Maternal Grandfather   . Heart disease Sister   . Crohn's disease Sister     History  Substance Use Topics  . Smoking status: Never Smoker   . Smokeless tobacco: Never Used  . Alcohol Use: Yes    Review of Systems  Constitutional: Positive for fatigue. Negative for fever and unexpected weight change.  Respiratory: Negative for cough, chest tightness and shortness of breath.   Cardiovascular: Negative for chest pain, palpitations and leg swelling.  Neurological: Negative for weakness, numbness and headaches.  Psychiatric/Behavioral: Positive for sleep disturbance. Negative for suicidal ideas, self-injury and dysphoric mood. The patient is not nervous/anxious.   All other systems reviewed and are negative.       Objective:   Physical Exam BP 110/78  Pulse 68  Temp(Src) 98 F (36.7 C) (Oral)  Wt 139 lb (63.05 kg)  BMI 22.45 kg/m2  SpO2 96% Wt Readings from Last 3 Encounters:  10/19/12 139 lb (63.05 kg)  07/23/12 138 lb 9.6 oz (62.869 kg)  07/03/12 140 lb (63.504 kg)   Constitutional: She appears well-developed and well-nourished. No distress.  HENT: Head: Normocephalic and atraumatic. Ears: B TMs ok, no erythema or effusion; Nose: Nose normal. Mouth/Throat: Oropharynx is clear and moist. No oropharyngeal exudate.  Eyes: Conjunctivae and EOM  are normal. Pupils are equal, round, and reactive to light. No scleral icterus.  Neck: Normal range of motion. Neck supple. No JVD present. No thyromegaly present.  Cardiovascular: Normal rate, regular rhythm and normal heart sounds.  No murmur heard. No BLE edema. Pulmonary/Chest: Effort normal and breath sounds normal. No respiratory distress. She has no wheezes.  Abdominal: Soft. Bowel sounds are normal. She exhibits no distension. There is no tenderness. no masses Musculoskeletal: Normal range of motion, no joint effusions. No gross deformities Neurological: She is alert and oriented to person, place, and time. No cranial nerve deficit. Coordination, balance, strength, speech and gait are normal.  Skin: Skin is warm and dry. No rash noted. No erythema.  Psychiatric: She has a normal mood and affect. Her behavior is normal. Judgment and thought content normal.   Lab Results  Component Value Date   WBC 6.4 06/17/2012   HGB 13.5 06/17/2012   HCT 39.6 06/17/2012   PLT 329 06/17/2012   GLUCOSE 101* 06/17/2012   NA 138 06/17/2012   K 3.9 06/17/2012   CL 103 06/17/2012   CREATININE 0.70 06/17/2012   BUN 15 06/17/2012   CO2 24 06/17/2012   TSH 0.756 07/03/2012      Assessment & Plan:   CPX/v70.0 - Patient has been counseled on age-appropriate routine health concerns for screening and prevention. These are reviewed and up-to-date. Immunizations are up-to-date or declined. Labs ordered and reviewed.  Also  See problem list. Medications and labs reviewed today.

## 2012-10-21 ENCOUNTER — Ambulatory Visit: Payer: Federal, State, Local not specified - PPO | Admitting: Internal Medicine

## 2012-11-01 ENCOUNTER — Encounter: Payer: Self-pay | Admitting: Nurse Practitioner

## 2012-11-20 ENCOUNTER — Other Ambulatory Visit (INDEPENDENT_AMBULATORY_CARE_PROVIDER_SITE_OTHER): Payer: Federal, State, Local not specified - PPO

## 2012-11-20 DIAGNOSIS — Z0000 Encounter for general adult medical examination without abnormal findings: Secondary | ICD-10-CM

## 2012-11-20 DIAGNOSIS — R7989 Other specified abnormal findings of blood chemistry: Secondary | ICD-10-CM

## 2012-11-20 LAB — LIPID PANEL
HDL: 73.7 mg/dL (ref >39.00)
Triglycerides: 66 mg/dL (ref 0.0–149.0)

## 2012-11-20 LAB — CBC WITH DIFFERENTIAL/PLATELET
Basophils Absolute: 0 10*3/uL (ref 0.0–0.1)
Basophils Relative: 0.7 % (ref 0.0–3.0)
Eosinophils Absolute: 0.2 10*3/uL (ref 0.0–0.7)
Eosinophils Relative: 3.3 % (ref 0.0–5.0)
HCT: 41 % (ref 36.0–46.0)
Hemoglobin: 14 g/dL (ref 12.0–15.0)
Monocytes Absolute: 0.6 10*3/uL (ref 0.1–1.0)
Monocytes Relative: 8.7 % (ref 3.0–12.0)
Neutro Abs: 4.3 10*3/uL (ref 1.4–7.7)
Platelets: 329 10*3/uL (ref 150.0–400.0)
RBC: 4.37 Mil/uL (ref 3.87–5.11)
WBC: 6.9 10*3/uL (ref 4.5–10.5)

## 2012-11-20 LAB — HEPATIC FUNCTION PANEL
ALT: 21 U/L (ref 0–35)
Albumin: 4.2 g/dL (ref 3.5–5.2)
Total Protein: 7.6 g/dL (ref 6.0–8.3)

## 2012-11-20 LAB — URINALYSIS, ROUTINE W REFLEX MICROSCOPIC
Hgb urine dipstick: NEGATIVE
Ketones, ur: NEGATIVE
Leukocytes, UA: NEGATIVE
RBC / HPF: NONE SEEN (ref 0–?)
Specific Gravity, Urine: 1.02 (ref 1.000–1.030)
Urine Glucose: NEGATIVE
Urobilinogen, UA: 0.2 (ref 0.0–1.0)

## 2012-11-20 LAB — TSH: TSH: 0.88 u[IU]/mL (ref 0.35–5.50)

## 2012-11-20 LAB — LDL CHOLESTEROL, DIRECT: Direct LDL: 135.9 mg/dL

## 2012-11-20 LAB — BASIC METABOLIC PANEL
BUN: 12 mg/dL (ref 6–23)
GFR: 89.38 mL/min (ref >60.00)
Potassium: 4.4 mEq/L (ref 3.5–5.1)
Sodium: 137 mEq/L (ref 135–145)

## 2012-11-21 ENCOUNTER — Encounter: Payer: Self-pay | Admitting: Nurse Practitioner

## 2012-11-23 ENCOUNTER — Encounter: Payer: Self-pay | Admitting: Nurse Practitioner

## 2012-11-23 ENCOUNTER — Ambulatory Visit (INDEPENDENT_AMBULATORY_CARE_PROVIDER_SITE_OTHER): Payer: Federal, State, Local not specified - PPO | Admitting: Nurse Practitioner

## 2012-11-23 VITALS — BP 100/66 | HR 64 | Ht 66.0 in | Wt 139.0 lb

## 2012-11-23 DIAGNOSIS — Z113 Encounter for screening for infections with a predominantly sexual mode of transmission: Secondary | ICD-10-CM

## 2012-11-23 NOTE — Patient Instructions (Addendum)
Sexually Transmitted Disease  Sexually transmitted disease (STD) refers to any infection that is passed from person to person during sexual activity. This may happen by way of saliva, semen, blood, vaginal mucus, or urine. Common STDs include:   Gonorrhea.   Chlamydia.   Syphilis.   HIV/AIDS.   Genital herpes.   Hepatitis B and C.   Trichomonas.   Human papillomavirus (HPV).   Pubic lice.  CAUSES   An STD may be spread by bacteria, virus, or parasite. A person can get an STD by:   Sexual intercourse with an infected person.   Sharing sex toys with an infected person.   Sharing needles with an infected person.   Having intimate contact with the genitals, mouth, or rectal areas of an infected person.  SYMPTOMS   Some people may not have any symptoms, but they can still pass the infection to others. Different STDs have different symptoms. Symptoms include:   Painful or bloody urination.   Pain in the pelvis, abdomen, vagina, anus, throat, or eyes.   Skin rash, itching, irritation, growths, or sores (lesions). These usually occur in the genital or anal area.   Abnormal vaginal discharge.   Penile discharge in men.   Soft, flesh-colored skin growths in the genital or anal area.   Fever.   Pain or bleeding during sexual intercourse.   Swollen glands in the groin area.   Yellow skin and eyes (jaundice). This is seen with hepatitis.  DIAGNOSIS   To make a diagnosis, your caregiver may:   Take a medical history.   Perform a physical exam.   Take a specimen (culture) to be examined.   Examine a sample of discharge under a microscope.   Perform blood tests.   Perform a Pap test, if this applies.   Perform a colposcopy.   Perform a laparoscopy.  TREATMENT    Chlamydia, gonorrhea, trichomonas, and syphilis can be cured with antibiotic medicine.   Genital herpes, hepatitis, and HIV can be treated, but not cured, with prescribed medicines. The medicines will lessen the symptoms.   Genital warts  from HPV can be treated with medicine or by freezing, burning (electrocautery), or surgery. Warts may come back.   HPV is a virus and cannot be cured with medicine or surgery.However, abnormal areas may be followed very closely by your caregiver and may be removed from the cervix, vagina, or vulva through office procedures or surgery.  If your diagnosis is confirmed, your recent sexual partners need treatment. This is true even if they are symptom-free or have a negative culture or evaluation. They should not have sex until their caregiver says it is okay.  HOME CARE INSTRUCTIONS   All sexual partners should be informed, tested, and treated for all STDs.   Take your antibiotics as directed. Finish them even if you start to feel better.   Only take over-the-counter or prescription medicines for pain, discomfort, or fever as directed by your caregiver.   Rest.   Eat a balanced diet and drink enough fluids to keep your urine clear or pale yellow.   Do not have sex until treatment is completed and you have followed up with your caregiver. STDs should be checked after treatment.   Keep all follow-up appointments, Pap tests, and blood tests as directed by your caregiver.   Only use latex condoms and water-soluble lubricants during sexual activity. Do not use petroleum jelly or oils.   Avoid alcohol and illegal drugs.   Get vaccinated   for HPV and hepatitis. If you have not received these vaccines in the past, talk to your caregiver about whether one or both might be right for you.   Avoid risky sex practices that can break the skin.  The only way to avoid getting an STD is to avoid all sexual activity.Latex condoms and dental dams (for oral sex) will help lessen the risk of getting an STD, but will not completely eliminate the risk.  SEEK MEDICAL CARE IF:    You have a fever.   You have any new or worsening symptoms.  Document Released: 05/18/2002 Document Revised: 05/20/2011 Document Reviewed:  05/25/2010  [employer] Patient Information 2014 [employer].

## 2012-11-24 ENCOUNTER — Encounter: Payer: Self-pay | Admitting: Nurse Practitioner

## 2012-11-24 LAB — STD PANEL: HIV: NONREACTIVE

## 2012-11-24 NOTE — Progress Notes (Signed)
Subjective:     Patient ID: April Vaughn, female   DOB: Sep 07, 1960, 52 y.o.   MRN: [id number]  HPI  This 52 yo SW Fe presents with history of possible STD exposure.  Her female partner has confessed to being unfaithful and being with female partners as well.  She ended the relationship in February but has recently been told the partner has HPV.  patient comes in today for testing and wants to go ahead and get pap done.  At her [facility] pap was not done as her previous HPV testing was negative.  She has no symptoms of PID or pain.   Review of Systems  Constitutional: Negative for fever, chills and fatigue.  HENT: Negative.   Respiratory: Negative.   Cardiovascular: Negative.   Gastrointestinal: Negative.   Genitourinary: Negative.  Negative for dysuria, urgency, vaginal discharge, vaginal pain, pelvic pain and dyspareunia.  Musculoskeletal: Negative.   Skin: Negative.   Neurological: Negative.   Psychiatric/Behavioral: Negative.        Objective:   Physical Exam  Constitutional: She is oriented to person, place, and time. She appears well-developed and well-nourished.  Abdominal: Soft. She exhibits no distension and no mass. There is no tenderness. There is no rebound and no guarding.  Genitourinary:  Normal vaginal discharge. No pain on bimanual.  Neurological: She is alert and oriented to person, place, and time.  Psychiatric: She has a normal mood and affect. Her behavior is normal. Judgment and thought content normal.       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

## 2012-11-25 ENCOUNTER — Telehealth: Payer: Self-pay | Admitting: Nurse Practitioner

## 2012-11-25 LAB — IPS PAP TEST WITH HPV

## 2012-11-25 NOTE — Telephone Encounter (Signed)
Notes Recorded by Luisa Dago, CMA on 11/25/2012 at 10:23 AM Pt notified of results. Pt voices understanding and is agreeable with plan. Advised HPV still pending.

## 2012-11-25 NOTE — Telephone Encounter (Signed)
Patient calling about some test results from a testing she had done on Monday. She didn't know if they were back yet.

## 2012-11-25 NOTE — Telephone Encounter (Signed)
Per note below patient has been informed of STD results. Awaiting HPV results.

## 2012-11-27 NOTE — Telephone Encounter (Signed)
Patient calling to check status of HPV results.

## 2012-11-27 NOTE — Telephone Encounter (Signed)
Results reviewed by Dr Hyacinth Meeker. Pap normal and HPV negative. Call to patient, VM has number confirmation, LM that "results requested are normal" and can call back on Monday

## 2012-11-29 NOTE — Progress Notes (Signed)
Encounter reviewed by Dr. Brook Silva.  

## 2012-12-10 ENCOUNTER — Encounter: Payer: Self-pay | Admitting: Internal Medicine

## 2012-12-10 ENCOUNTER — Ambulatory Visit (INDEPENDENT_AMBULATORY_CARE_PROVIDER_SITE_OTHER): Payer: Federal, State, Local not specified - PPO | Admitting: Internal Medicine

## 2012-12-10 VITALS — BP 114/80 | HR 67 | Temp 97.0°F | Resp 16 | Ht 66.0 in | Wt 138.0 lb

## 2012-12-10 DIAGNOSIS — J209 Acute bronchitis, unspecified: Secondary | ICD-10-CM

## 2012-12-10 MED ORDER — HYDROCODONE-HOMATROPINE 5-1.5 MG/5ML PO SYRP: 5.0000 mL | ORAL_SOLUTION | Freq: Three times a day (TID) | ORAL | Status: DC | PRN

## 2012-12-10 MED ORDER — CEFUROXIME AXETIL 500 MG PO TABS: 500.0000 mg | ORAL_TABLET | Freq: Two times a day (BID) | ORAL | Status: DC

## 2012-12-10 NOTE — Assessment & Plan Note (Signed)
I will treat the infection with ceftin and will control the cough with hycodan 

## 2012-12-10 NOTE — Progress Notes (Signed)
  Subjective:    Patient ID: April Vaughn, female    DOB: 1960-08-20, 52 y.o.   MRN: [id number]  URI  This is a new problem. The current episode started 1 to 4 weeks ago. The problem has been gradually worsening. The maximum temperature recorded prior to her arrival was 100 - 100.9 F. The fever has been present for 3 to 4 days. Associated symptoms include coughing (productive of yellow phlegm), a plugged ear sensation, rhinorrhea, sinus pain and a sore throat. Pertinent negatives include no abdominal pain, chest pain, congestion, diarrhea, dysuria, ear pain, headaches, joint pain, joint swelling, nausea, neck pain, rash, sneezing, swollen glands, vomiting or wheezing. She has tried decongestant for the symptoms. The treatment provided mild relief.      Review of Systems  Constitutional: Negative.  Negative for fever, chills, diaphoresis, activity change, appetite change and fatigue.  HENT: Positive for sore throat, rhinorrhea and sinus pressure. Negative for ear pain, congestion, facial swelling, sneezing, neck pain and postnasal drip.   Eyes: Negative.   Respiratory: Positive for cough (productive of yellow phlegm). Negative for apnea, choking, chest tightness, shortness of breath, wheezing and stridor.   Cardiovascular: Negative.  Negative for chest pain, palpitations and leg swelling.  Gastrointestinal: Negative.  Negative for nausea, vomiting, abdominal pain and diarrhea.  Endocrine: Negative.   Genitourinary: Negative.  Negative for dysuria.  Musculoskeletal: Negative.  Negative for joint pain.  Skin: Negative.  Negative for rash.  Allergic/Immunologic: Negative.   Neurological: Negative.  Negative for dizziness, tremors, weakness and headaches.  Hematological: Negative.  Negative for adenopathy. Does not bruise/bleed easily.  Psychiatric/Behavioral: Negative.        Objective:   Physical Exam  Vitals reviewed. Constitutional: She is oriented to person, place, and time. She  appears well-developed and well-nourished.  Non-toxic appearance. She does not have a sickly appearance. She does not appear ill. No distress.  HENT:  Right Ear: Hearing, tympanic membrane, external ear and ear canal normal.  Left Ear: Hearing, tympanic membrane, external ear and ear canal normal.  Nose: No mucosal edema or rhinorrhea. Right sinus exhibits maxillary sinus tenderness. Right sinus exhibits no frontal sinus tenderness. Left sinus exhibits maxillary sinus tenderness. Left sinus exhibits no frontal sinus tenderness.  Mouth/Throat: Mucous membranes are normal. Mucous membranes are not pale, not dry and not cyanotic. No oral lesions. No trismus in the jaw. No edematous. Posterior oropharyngeal erythema present. No oropharyngeal exudate, posterior oropharyngeal edema or tonsillar abscesses.  Eyes: Conjunctivae are normal. Right eye exhibits no discharge. Left eye exhibits no discharge. No scleral icterus.  Neck: Normal range of motion. Neck supple. No JVD present. No tracheal deviation present. No thyromegaly present.  Cardiovascular: Normal rate, regular rhythm, normal heart sounds and intact distal pulses.  Exam reveals no gallop and no friction rub.   No murmur heard. Pulmonary/Chest: Effort normal and breath sounds normal. No stridor. No respiratory distress. She has no wheezes. She has no rales. She exhibits no tenderness.  Abdominal: Soft. Bowel sounds are normal. She exhibits no distension and no mass. There is no tenderness. There is no rebound and no guarding.  Musculoskeletal: Normal range of motion. She exhibits no edema and no tenderness.  Lymphadenopathy:    She has no cervical adenopathy.  Neurological: She is oriented to person, place, and time.  Skin: Skin is warm and dry. No rash noted. No erythema. No pallor.          Assessment & Plan:

## 2012-12-10 NOTE — Patient Instructions (Signed)

## 2013-01-12 ENCOUNTER — Encounter: Payer: Self-pay | Admitting: Internal Medicine

## 2013-01-12 DIAGNOSIS — Z0000 Encounter for general adult medical examination without abnormal findings: Secondary | ICD-10-CM

## 2013-01-12 MED ORDER — ESCITALOPRAM OXALATE 10 MG PO TABS: 10.0000 mg | ORAL_TABLET | Freq: Every day | ORAL | Status: DC

## 2013-01-14 ENCOUNTER — Other Ambulatory Visit: Payer: Self-pay

## 2013-03-15 ENCOUNTER — Other Ambulatory Visit: Payer: Self-pay | Admitting: Internal Medicine

## 2013-03-15 MED ORDER — ALPRAZOLAM 0.5 MG PO TABS: 0.5000 mg | ORAL_TABLET | Freq: Two times a day (BID) | ORAL | Status: DC | PRN

## 2013-03-30 ENCOUNTER — Other Ambulatory Visit: Payer: Self-pay

## 2013-03-30 DIAGNOSIS — Z1231 Encounter for screening mammogram for malignant neoplasm of breast: Secondary | ICD-10-CM

## 2013-04-16 ENCOUNTER — Ambulatory Visit: Payer: Federal, State, Local not specified - PPO

## 2013-04-28 ENCOUNTER — Ambulatory Visit
Admission: RE | Admit: 2013-04-28 | Discharge: 2013-04-28 | Disposition: A | Discharge status: Home / Self Care | Payer: Federal, State, Local not specified - PPO | Source: Ambulatory Visit

## 2013-04-28 DIAGNOSIS — Z1231 Encounter for screening mammogram for malignant neoplasm of breast: Secondary | ICD-10-CM

## 2013-06-21 ENCOUNTER — Encounter: Payer: Self-pay | Admitting: Internal Medicine

## 2013-07-06 ENCOUNTER — Ambulatory Visit (INDEPENDENT_AMBULATORY_CARE_PROVIDER_SITE_OTHER): Payer: Federal, State, Local not specified - PPO | Admitting: Nurse Practitioner

## 2013-07-06 ENCOUNTER — Encounter: Payer: Self-pay | Admitting: Nurse Practitioner

## 2013-07-06 VITALS — BP 110/74 | HR 64 | Ht 65.5 in | Wt 139.0 lb

## 2013-07-06 DIAGNOSIS — A609 Anogenital herpesviral infection, unspecified: Secondary | ICD-10-CM

## 2013-07-06 DIAGNOSIS — Z01419 Encounter for gynecological examination (general) (routine) without abnormal findings: Secondary | ICD-10-CM

## 2013-07-06 DIAGNOSIS — Z780 Asymptomatic menopausal state: Secondary | ICD-10-CM

## 2013-07-06 DIAGNOSIS — B009 Herpesviral infection, unspecified: Secondary | ICD-10-CM

## 2013-07-06 DIAGNOSIS — Z0000 Encounter for general adult medical examination without abnormal findings: Secondary | ICD-10-CM

## 2013-07-06 LAB — POCT URINALYSIS DIPSTICK
BILIRUBIN UA: NEGATIVE
GLUCOSE UA: NEGATIVE
Ketones, UA: NEGATIVE
Leukocytes, UA: NEGATIVE
Nitrite, UA: NEGATIVE
Protein, UA: NEGATIVE
RBC UA: NEGATIVE
UROBILINOGEN UA: NEGATIVE
pH, UA: 5

## 2013-07-06 LAB — HEMOGLOBIN, FINGERSTICK: Hemoglobin, fingerstick: 13.6 g/dL (ref 12.0–16.0)

## 2013-07-06 MED ORDER — VALACYCLOVIR HCL 1 G PO TABS: 1000.0000 mg | ORAL_TABLET | ORAL | Status: DC | PRN

## 2013-07-06 MED ORDER — ESTRADIOL 0.5 MG PO TABS: 0.5000 mg | ORAL_TABLET | Freq: Every day | ORAL | Status: DC

## 2013-07-06 MED ORDER — PROGESTERONE MICRONIZED 200 MG PO CAPS: 200.0000 mg | ORAL_CAPSULE | Freq: Every day | ORAL | Status: DC

## 2013-07-06 NOTE — Progress Notes (Signed)
Patient ID: April ChattersLynda S Vaughn, female   DOB: 04-18-1960, 53 y.o.   MRN: [id number] 53 y.o. G0P0 Single Caucasian Fe here for annual exam.  On HRT since 2013.  No vaso symptoms. New female partner for 8 months.  Both have been tested before relationship.  She has noted some seborrheic keratosis on her back and has seen the dermatologist.  For the past 8 months or so has noted a subtle change in the left nipple.  States not as 'perky'. She notes no pain or discharge. Normal mammogram in February.  Patient's last menstrual period was 04/12/2011.          Sexually active: yes  The current method of family planning is none and same sex partner.    Exercising: yes  Home exercise routine includes stretching, walking 6 hrs per week and yoga. Smoker:  no  Health Maintenance: Pap:  11/23/12, WNL, neg HR HPV MMG:  04/28/13, Bi-Rads 1: negative Colonoscopy:  09/07/10, polyp, diverticula, repeat in 5 years TDaP:  10/19/11 Labs:  HB:  13.6  Urine:  Negative   reports that she has never smoked. She has never used smokeless tobacco. She reports that she drinks about 2 ounces of alcohol per week. She reports that she does not use illicit drugs.  Past Medical History  Diagnosis Date  . Chloasma   . Diverticulosis 2012  . 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 History of PCR DNA positive for HSV1   . Anxiety   . Depression     Past Surgical History  Procedure Laterality Date  . Colonoscopy  09/07/2010  . Vericose vein Right 06/2011    Current Outpatient Prescriptions  Medication Sig Dispense Refill  . ALPRAZolam (XANAX) 0.5 MG tablet Take 1 tablet (0.5 mg total) by mouth 2 (two) times daily as needed for sleep or anxiety.  20 tablet  0  . estradiol (ESTRACE) 0.5 MG tablet Take 1 tablet (0.5 mg total) by mouth daily.  90 tablet  3  . progesterone (PROMETRIUM) 200 MG capsule Take 1 capsule (200 mg total) by mouth daily. Pt takes only the 1st-10th of month  30 capsule  3  . valACYclovir (VALTREX) 1000 MG tablet  Take 1 tablet (1,000 mg total) by mouth as needed.  30 tablet  12  . Multiple Vitamin (MULTIVITAMIN) tablet Take 1 tablet by mouth daily.       No current facility-administered medications for this visit.    Family History  Problem Relation Age of Onset  . Heart disease Mother   . Hypertension Father   . Heart disease Father   . Kidney failure Father   . Colon cancer Maternal Grandfather   . Heart disease Sister   . Crohn's disease Sister     ROS:  Pertinent items are noted in HPI.  Otherwise, a comprehensive ROS was negative.  Exam:   BP 110/74  Pulse 64  Ht 5' 5.5" (1.664 m)  Wt 139 lb (63.05 kg)  BMI 22.77 kg/m2  LMP 04/12/2011 Height: 5' 5.5" (166.4 cm)  Ht Readings from Last 3 Encounters:  07/06/13 5' 5.5" (1.664 m)  12/10/12 5\' 6"  (1.676 m)  11/23/12 5\' 6"  (1.676 m)    General appearance: alert, cooperative and appears stated age Head: Normocephalic, without obvious abnormality, atraumatic Neck: no adenopathy, supple, symmetrical, trachea midline and thyroid normal to inspection and palpation Lungs: clear to auscultation bilaterally Breasts: normal appearance, no masses or tenderness, No nipple retraction or dimpling, No nipple  discharge or bleeding Heart: regular rate and rhythm Abdomen: soft, non-tender; no masses,  no organomegaly Extremities: extremities normal, atraumatic, no cyanosis or edema Skin: Skin color, texture, turgor normal. No rashes or lesions Lymph nodes: Cervical, supraclavicular, and axillary nodes normal. No abnormal inguinal nodes palpated Neurologic: Grossly normal   Pelvic: External genitalia:  no lesions              Urethra:  normal appearing urethra with no masses, tenderness or lesions              Bartholin's and Skene's: normal                 Vagina: normal appearing vagina with normal color and discharge, no lesions              Cervix: anteverted              Pap taken: no Bimanual Exam:  Uterus:  normal size, contour,  position, consistency, mobility, non-tender              Adnexa: no mass, fullness, tenderness               Rectovaginal: Confirms               Anus:  normal sphincter tone, no lesions  A:  Well Woman with normal exam  Postmenopausal on HRT since 06/2011  History of anxiety and depression  P:   Reviewed health and wellness pertinent to exam  Pap smear not taken today  Mammogram due 04/2014 - if any other changes will repeat earlier  Refill on Estrace and Prometrium for a year  Counseled with risk of CVA, DVT, cancer, etc  Refill on Valtrex for a year  Counseled on breast self exam, mammography screening, use and side effects of HRT, adequate intake of calcium and vitamin D, diet and exercise return annually or prn  An After Visit Summary was printed and given to the patient.

## 2013-07-06 NOTE — Patient Instructions (Signed)

## 2013-07-08 NOTE — Progress Notes (Signed)
Encounter reviewed by Dr. Janique Hoefer Silva.  

## 2013-10-20 ENCOUNTER — Ambulatory Visit (INDEPENDENT_AMBULATORY_CARE_PROVIDER_SITE_OTHER): Payer: Federal, State, Local not specified - PPO | Admitting: Internal Medicine

## 2013-10-20 ENCOUNTER — Encounter: Payer: Self-pay | Admitting: Internal Medicine

## 2013-10-20 VITALS — BP 109/71 | HR 75 | Temp 98.8°F | Wt 138.2 lb

## 2013-10-20 DIAGNOSIS — R5381 Other malaise: Secondary | ICD-10-CM

## 2013-10-20 DIAGNOSIS — J018 Other acute sinusitis: Secondary | ICD-10-CM

## 2013-10-20 DIAGNOSIS — R5383 Other fatigue: Secondary | ICD-10-CM

## 2013-10-20 MED ORDER — AMOXICILLIN 500 MG PO CAPS: 500.0000 mg | ORAL_CAPSULE | Freq: Three times a day (TID) | ORAL | Status: DC

## 2013-10-20 NOTE — Progress Notes (Signed)
Pre visit review using our clinic review tool, if applicable. No additional management support is needed unless otherwise documented below in the visit note. 

## 2013-10-20 NOTE — Progress Notes (Signed)
   Subjective:    Patient ID: April ChattersLynda S Comley, female    DOB: 31-Oct-1960, 53 y.o.   MRN: [id number]  HPI  She has had significant tiredness over the  last 2 weeks even with adequate rest. She denies snoring or being told she has apnea  As of 10/18/15 she developed a sore throat with head and chest congestion. She's had low grade fever she believes with chills and sweats  She has frontal headache , facial pain , dental pain & sore throat which persist. She has had some discolored nasal secretions & pressure in ears.  She's had some shortness of breath and wheezing.   Review of Systems  She has some myalgias and arthralgias and generally feels weak.  Over the last 2 days she's had some dizziness and nausea as well.  She denies otic discharge or productive cough.  She has no significant extrinsic symptoms.        Objective:   Physical Exam  Pertinent or positive findings include: She does appear fatigued. There is a wax impaction on the right.  General appearance:well nourished; no acute distress or increased work of breathing is present.  No  lymphadenopathy about the head, neck, or axilla noted.  Eyes: No conjunctival inflammation or lid edema is present. There is no scleral icterus. Ears:  External ear exam shows no significant lesions or deformities.  L tympanic membrane intact without bulging, retraction, inflammation or discharge. Nose:  External nasal examination shows no deformity or inflammation. Nasal mucosa are pink and moist without lesions or exudates. No septal dislocation or deviation.No obstruction to airflow.  Oral exam: Dental hygiene is good; lips and gums are healthy appearing.There is no oropharyngeal erythema or exudate noted.  Neck:  No deformities, thyromegaly, masses, or tenderness noted.   Supple with full range of motion without pain.  Heart:  Normal rate and regular rhythm. S1 and S2 normal without gallop, murmur, click, rub or other extra sounds.    Lungs:Chest clear to auscultation; no wheezes, rhonchi,rales ,or rubs present.No increased work of breathing.   Extremities:  No cyanosis, edema, or clubbing  noted Skin: Warm & dry w/o jaundice or tenting.         Assessment & Plan:  #1 rhinosinusitis without significant bronchitis #2 dizziness due to #1 & otic impaction  Plan: Nasal hygiene interventions discussed. See prescription medications

## 2013-10-20 NOTE — Patient Instructions (Signed)
Plain Mucinex (NOT D) for thick secretions ;force NON dairy fluids .   Nasal cleansing in the shower as discussed with lather of mild shampoo.After 10 seconds wash off lather while  exhaling through nostrils. Make sure that all residual soap is removed to prevent irritation.  Flonase OR Nasacort AQ 1 spray in each nostril twice a day as needed. Use the "crossover" technique into opposite nostril spraying toward opposite ear @ 45 degree angle, not straight up into nostril.  Use a Neti pot daily only  as needed for significant sinus congestion; going from open side to congested side . Plain Allegra (NOT D )  160 daily , Loratidine 10 mg , OR Zyrtec 10 mg @ bedtime  as needed for itchy eyes & sneezing.  Please do not use Q-tips as we discussed. Should wax build up occur, please put 2-3 drops of mineral oil in the affected  ear at night to soften the wax .Cover the canal with a  cotton ball to prevent the oil from staining bed linens. In the morning fill the ear canal with hydrogen peroxide & lie in the opposite lateral decubitus position(on the side opposite the affected ear)  for 10-15 minutes. After allowing this period of time for the peroxide to dissolve the wax ;shower and use the thinnest washrag available to wick out the wax. If both ears are involved ; alternate this treatment from ear to ear each night until no wax is found on the washrag. 

## 2013-12-24 ENCOUNTER — Other Ambulatory Visit: Payer: Self-pay

## 2014-01-19 ENCOUNTER — Ambulatory Visit (INDEPENDENT_AMBULATORY_CARE_PROVIDER_SITE_OTHER): Payer: Federal, State, Local not specified - PPO | Admitting: Internal Medicine

## 2014-01-19 ENCOUNTER — Encounter: Payer: Self-pay | Admitting: Internal Medicine

## 2014-01-19 ENCOUNTER — Other Ambulatory Visit (INDEPENDENT_AMBULATORY_CARE_PROVIDER_SITE_OTHER): Payer: Federal, State, Local not specified - PPO

## 2014-01-19 VITALS — BP 108/60 | HR 67 | Temp 98.3°F | Ht 65.5 in | Wt 142.2 lb

## 2014-01-19 DIAGNOSIS — R194 Change in bowel habit: Secondary | ICD-10-CM

## 2014-01-19 DIAGNOSIS — Z0000 Encounter for general adult medical examination without abnormal findings: Secondary | ICD-10-CM

## 2014-01-19 DIAGNOSIS — H6121 Impacted cerumen, right ear: Secondary | ICD-10-CM

## 2014-01-19 DIAGNOSIS — R590 Localized enlarged lymph nodes: Secondary | ICD-10-CM

## 2014-01-19 LAB — CBC WITH DIFFERENTIAL/PLATELET
Basophils Absolute: 0 10*3/uL (ref 0.0–0.1)
Basophils Relative: 0.4 % (ref 0.0–3.0)
EOS PCT: 5.3 % — AB (ref 0.0–5.0)
Eosinophils Absolute: 0.5 10*3/uL (ref 0.0–0.7)
HEMATOCRIT: 42.5 % (ref 36.0–46.0)
HEMOGLOBIN: 14 g/dL (ref 12.0–15.0)
LYMPHS ABS: 2 10*3/uL (ref 0.7–4.0)
Lymphocytes Relative: 23.4 % (ref 12.0–46.0)
MCHC: 33 g/dL (ref 30.0–36.0)
MCV: 94.9 fl (ref 78.0–100.0)
MONO ABS: 0.7 10*3/uL (ref 0.1–1.0)
MONOS PCT: 8.4 % (ref 3.0–12.0)
NEUTROS ABS: 5.4 10*3/uL (ref 1.4–7.7)
Neutrophils Relative %: 62.5 % (ref 43.0–77.0)
Platelets: 377 10*3/uL (ref 150.0–400.0)
RBC: 4.48 Mil/uL (ref 3.87–5.11)
RDW: 13 % (ref 11.5–15.5)
WBC: 8.7 10*3/uL (ref 4.0–10.5)

## 2014-01-19 LAB — TSH: TSH: 0.87 u[IU]/mL (ref 0.35–4.50)

## 2014-01-19 LAB — URINALYSIS, ROUTINE W REFLEX MICROSCOPIC
BILIRUBIN URINE: NEGATIVE
Hgb urine dipstick: NEGATIVE
Ketones, ur: NEGATIVE
LEUKOCYTES UA: NEGATIVE
NITRITE: NEGATIVE
SPECIFIC GRAVITY, URINE: 1.01 (ref 1.000–1.030)
TOTAL PROTEIN, URINE-UPE24: NEGATIVE
Urine Glucose: NEGATIVE
Urobilinogen, UA: 0.2 (ref 0.0–1.0)
pH: 7 (ref 5.0–8.0)

## 2014-01-19 LAB — HEPATIC FUNCTION PANEL
ALT: 19 U/L (ref 0–35)
AST: 19 U/L (ref 0–37)
Albumin: 3.9 g/dL (ref 3.5–5.2)
Alkaline Phosphatase: 48 U/L (ref 39–117)
Bilirubin, Direct: 0.1 mg/dL (ref 0.0–0.3)
Total Bilirubin: 0.6 mg/dL (ref 0.2–1.2)
Total Protein: 8 g/dL (ref 6.0–8.3)

## 2014-01-19 LAB — LIPID PANEL
CHOLESTEROL: 217 mg/dL — AB (ref 0–200)
HDL: 61.6 mg/dL (ref >39.00)
LDL CALC: 129 mg/dL — AB (ref 0–99)
NonHDL: 155.4
Total CHOL/HDL Ratio: 4
Triglycerides: 132 mg/dL (ref 0.0–149.0)
VLDL: 26.4 mg/dL (ref 0.0–40.0)

## 2014-01-19 LAB — BASIC METABOLIC PANEL
BUN: 12 mg/dL (ref 6–23)
CHLORIDE: 103 meq/L (ref 96–112)
CO2: 23 mEq/L (ref 19–32)
Calcium: 9.6 mg/dL (ref 8.4–10.5)
Creatinine, Ser: 0.8 mg/dL (ref 0.4–1.2)
GFR: 83.66 mL/min (ref >60.00)
Glucose, Bld: 93 mg/dL (ref 70–99)
POTASSIUM: 4.5 meq/L (ref 3.5–5.1)
SODIUM: 139 meq/L (ref 135–145)

## 2014-01-19 MED ORDER — METRONIDAZOLE 500 MG PO TABS: 500.0000 mg | ORAL_TABLET | Freq: Three times a day (TID) | ORAL | Status: DC

## 2014-01-19 NOTE — Progress Notes (Signed)
Subjective:    Patient ID: April ChattersLynda S Dinse, female    DOB: 04/20/1960, 53 y.o.   MRN: [id number]  HPI  patient is here today for annual physical. Patient feels well in general - see ROS below for concerns  Also reviewed chronic medical issues and interval medical events  Past Medical History  Diagnosis Date  . Chloasma   . Diverticulosis 2012  . 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 History of PCR DNA positive for HSV1   . Anxiety   . Depression    Family History  Problem Relation Age of Onset  . Coronary artery disease Mother [id number]    CABG, died 5174 due to MI  . Hypertension Father   . Coronary artery disease Father   . Kidney failure Father   . Colon cancer Maternal Grandfather [id number]  . Coronary artery disease Sister [id number]    MI, 7 stents  . Crohn's disease Sister [id number]  . Aneurysm Mother     behind eye  . Fibromyalgia Sister     ?RA vs FM   History  Substance Use Topics  . Smoking status: Never Smoker   . Smokeless tobacco: Never Used  . Alcohol Use: 2.0 oz/week    4 drink(s) per week    Review of Systems  Constitutional: Negative for fatigue and unexpected weight change.  HENT: Negative for dental problem, ear discharge, ear pain, hearing loss and postnasal drip.   Respiratory: Negative for cough, shortness of breath and wheezing.   Cardiovascular: Negative for chest pain, palpitations and leg swelling.  Gastrointestinal: Positive for diarrhea ("loose" bowels since 12/20/13, 2-3 BM/d, soft, not liquid [place] - no blood, no fever, no pain, no weight loss). Negative for nausea, vomiting, abdominal pain, constipation, blood in stool and rectal pain.  Musculoskeletal: Positive for neck pain (on R side - "tender" and "thick" x 4-6 wks). Negative for joint swelling and neck stiffness.  Skin: Positive for wound (L calf bruise following snake bite 06/2013). Negative for rash.  Neurological: Negative for dizziness, weakness, light-headedness and  headaches.  Psychiatric/Behavioral: Negative for dysphoric mood. The patient is not nervous/anxious.   All other systems reviewed and are negative.      Objective:   Physical Exam  BP 108/60 mmHg  Pulse 67  Temp(Src) 98.3 F (36.8 C) (Oral)  Ht 5' 5.5" (1.664 m)  Wt 142 lb 4 oz (64.524 kg)  BMI 23.30 kg/m2  SpO2 97% Wt Readings from Last 3 Encounters:  01/19/14 142 lb 4 oz (64.524 kg)  10/20/13 138 lb 4 oz (62.71 kg)  07/06/13 139 lb (63.05 kg)   Constitutional: She appears well-developed and well-nourished. No distress.  HENT: Head: Normocephalic and atraumatic. Ears: R ear with cerumen impaction - after irrigation, B TMs ok, no erythema or effusion; Nose: Nose normal. Mouth/Throat: Oropharynx is clear and moist. No oropharyngeal exudate.  Eyes: Conjunctivae and EOM are normal. Pupils are equal, round, and reactive to light. No scleral icterus.  Neck: Normal range of motion. Neck supple. Mild R side LAD with tenderness to palp. No JVD present. No thyromegaly present.  Cardiovascular: Normal rate, regular rhythm and normal heart sounds.  No murmur heard. No BLE edema. Pulmonary/Chest: Effort normal and breath sounds normal. No respiratory distress. She has no wheezes.  Abdominal: Soft. Bowel sounds are normal. She exhibits no distension. There is no tenderness. no masses GU/breast: defer to gyn Musculoskeletal: Normal range of motion, no joint  effusions. No gross deformities Neurological: She is alert and oriented to person, place, and time. No cranial nerve deficit. Coordination, balance, strength, speech and gait are normal.  Skin: Skin is warm and dry. No rash noted. No erythema.  Psychiatric: She has a normal mood and affect. Her behavior is normal. Judgment and thought content normal.    Lab Results  Component Value Date   WBC 6.9 11/20/2012   HGB 14.0 11/20/2012   HCT 41.0 11/20/2012   PLT 329.0 11/20/2012   GLUCOSE 86 11/20/2012   CHOL 225* 11/20/2012   TRIG 66.0  11/20/2012   HDL 73.70 11/20/2012   LDLDIRECT 135.9 11/20/2012   ALT 21 11/20/2012   AST 19 11/20/2012   NA 137 11/20/2012   K 4.4 11/20/2012   CL 106 11/20/2012   CREATININE 0.7 11/20/2012   BUN 12 11/20/2012   CO2 26 11/20/2012   TSH 0.88 11/20/2012    Mm Screening Breast Tomo Bilateral  04/28/2013   CLINICAL DATA:  Screening.  EXAM: DIGITAL SCREENING BILATERAL MAMMOGRAM WITH 3D TOMO WITH CAD  COMPARISON:  Previous exam(s).  ACR Breast Density Category b: There are scattered areas of fibroglandular density.  FINDINGS: There are no findings suspicious for malignancy. Images were processed with CAD.  IMPRESSION: No mammographic evidence of malignancy. A result letter of this screening mammogram will be mailed directly to the patient.  RECOMMENDATION: Screening mammogram in one year. (Code:SM-B-01Y)  BI-RADS CATEGORY  1: Negative.   Electronically Signed   By: Baird Lyonsina  Arceo M.D.   On: 04/28/2013 16:31    Procedure: wax removal, R Reason: wax impaction, R Risks and benefits of procedure discussed with the patient who agrees to proceed. Ear irrigated with warm water. Large amount of wax removed. Instrumentation with metal ear loop was performed to accomplish wax removal. the patient tolerated procedure well.     Assessment & Plan:   CPX/z00.00 - Patient has been counseled on age-appropriate routine health concerns for screening and prevention. These are reviewed and up-to-date. Immunizations are up-to-date or declined. Labs ordered and reviewed.  Right neck tenderness with mild shoddy lymphadenopathy. Irrigation of year without apparent problem causing reaction. No apparent dental problem or sinus infection. Will check ultrasound of neck at patient request to rule out underlying anatomic problem.  Change in bowels. Suspect bacterial overgrowth rather than infection given lack of red flags. Also reviewed family history of Crohn's with prior unremarkable colonoscopy. Will treat with 7 days  metronidazole and encourage probiotic use. If continued problems in the next 2-3 weeks patient will call for referral to GI for follow-up as needed, we'll call sooner if worse  Snakebite right calf April 2015 reviewed. Explained natural course of resolution with skin discoloration as expected. No treatment changes recommended  Right side cerumen impaction. Irrigation as above. No apparent complications

## 2014-01-19 NOTE — Patient Instructions (Addendum)
It was good to see you today.  We have reviewed your prior records including labs and tests today  Health Maintenance reviewed - consider your annual flu shot, all other recommended immunizations and age-appropriate screenings are up-to-date.  Test(s) ordered today. Your results will be released to [place] (or called to you) after review, usually within 72hours after test completion. If any changes need to be made, you will be notified at that same time.  Medications reviewed and updated Take metronidazole antibiotics 3 times a day for one week to help reset bowel function - Your prescription(s) have been submitted to your pharmacy. Please take as directed and contact our office if you believe you are having problem(s) with the medication(s).  Also use probiotic daily for next 2 weeks as discussed - no other changes recommended at this time.  Continued bowel problems in the next 2-3 weeks, call for referral to Dr. Collene Mares as needed, please call sooner if worse  we'll make referral For ultrasound of your neck as discussed. Our office will contact you regarding appointment(s) once made.  Your ear has been irrigated of wax today -let us know if continued problems persist for referral to audiologist and hearing testing  Please schedule followup in 12 months for annual exam and labs, call sooner if problems.  Health Maintenance Adopting a healthy lifestyle and getting preventive care can go a long way to promote health and wellness. Talk with your health care provider about what schedule of regular examinations is right for you. This is a good chance for you to check in with your provider about disease prevention and staying healthy. In between checkups, there are plenty of things you can do on your own. Experts have done a lot of research about which lifestyle changes and preventive measures are most likely to keep you healthy. Ask your health care provider for more information. WEIGHT AND DIET   Eat a healthy diet  Be sure to include plenty of vegetables, fruits, low-fat dairy products, and lean protein.  Do not eat a lot of foods high in solid fats, added sugars, or salt.  Get regular exercise. This is one of the most important things you can do for your health.  Most adults should exercise for at least 150 minutes each week. The exercise should increase your heart rate and make you sweat (moderate-intensity exercise).  Most adults should also do strengthening exercises at least twice a week. This is in addition to the moderate-intensity exercise.  Maintain a healthy weight  Body mass index (BMI) is a measurement that can be used to identify possible weight problems. It estimates body fat based on height and weight. Your health care provider can help determine your BMI and help you achieve or maintain a healthy weight.  For females 38 years of age and older:   A BMI below 18.5 is considered underweight.  A BMI of 18.5 to 24.9 is normal.  A BMI of 25 to 29.9 is considered overweight.  A BMI of 30 and above is considered obese.  Watch levels of cholesterol and blood lipids  You should start having your blood tested for lipids and cholesterol at 52 years of age, then have this test every 5 years.  You may need to have your cholesterol levels checked more often if:  Your lipid or cholesterol levels are high.  You are older than 52 years of age.  You are at high risk for heart disease.  CANCER SCREENING   Lung Cancer  Lung cancer screening is recommended for adults 20-7 years old who are at high risk for lung cancer because of a history of smoking.  A yearly low-dose CT scan of the lungs is recommended for people who:  Currently smoke.  Have quit within the past 15 years.  Have at least a 30-pack-year history of smoking. A pack year is smoking an average of one pack of cigarettes a day for 1 year.  Yearly screening should continue until it has been 15  years since you quit.  Yearly screening should stop if you develop a health problem that would prevent you from having lung cancer treatment.  Breast Cancer  Practice breast self-awareness. This means understanding how your breasts normally appear and feel.  It also means doing regular breast self-exams. Let your health care provider know about any changes, no matter how small.  If you are in your 20s or 30s, you should have a clinical breast exam (CBE) by a health care provider every 1-3 years as part of a regular health exam.  If you are 1 or older, have a CBE every year. Also consider having a breast X-ray (mammogram) every year.  If you have a family history of breast cancer, talk to your health care provider about genetic screening.  If you are at high risk for breast cancer, talk to your health care provider about having an MRI and a mammogram every year.  Breast cancer gene (BRCA) assessment is recommended for women who have family members with BRCA-related cancers. BRCA-related cancers include:  Breast.  Ovarian.  Tubal.  Peritoneal cancers.  Results of the assessment will determine the need for genetic counseling and BRCA1 and BRCA2 testing. Cervical Cancer Routine pelvic examinations to screen for cervical cancer are no longer recommended for nonpregnant women who are considered low risk for cancer of the pelvic organs (ovaries, uterus, and vagina) and who do not have symptoms. A pelvic examination may be necessary if you have symptoms including those associated with pelvic infections. Ask your health care provider if a screening pelvic exam is right for you.   The Pap test is the screening test for cervical cancer for women who are considered at risk.  If you had a hysterectomy for a problem that was not cancer or a condition that could lead to cancer, then you no longer need Pap tests.  If you are older than 65 years, and you have had normal Pap tests for the past 10  years, you no longer need to have Pap tests.  If you have had past treatment for cervical cancer or a condition that could lead to cancer, you need Pap tests and screening for cancer for at least 20 years after your treatment.  If you no longer get a Pap test, assess your risk factors if they change (such as having a new sexual partner). This can affect whether you should start being screened again.  Some women have medical problems that increase their chance of getting cervical cancer. If this is the case for you, your health care provider may recommend more frequent screening and Pap tests.  The human papillomavirus (HPV) test is another test that may be used for cervical cancer screening. The HPV test looks for the virus that can cause cell changes in the cervix. The cells collected during the Pap test can be tested for HPV.  The HPV test can be used to screen women 75 years of age and older. Getting tested for HPV can  extend the interval between normal Pap tests from three to five years.  An HPV test also should be used to screen women of any age who have unclear Pap test results.  After 52 years of age, women should have HPV testing as often as Pap tests.  Colorectal Cancer  This type of cancer can be detected and often prevented.  Routine colorectal cancer screening usually begins at 52 years of age and continues through 52 years of age.  Your health care provider may recommend screening at an earlier age if you have risk factors for colon cancer.  Your health care provider may also recommend using home test kits to check for hidden blood in the stool.  A small camera at the end of a tube can be used to examine your colon directly (sigmoidoscopy or colonoscopy). This is done to check for the earliest forms of colorectal cancer.  Routine screening usually begins at age 50.  Direct examination of the colon should be repeated every 5-10 years through 52 years of age. However, you may  need to be screened more often if early forms of precancerous polyps or small growths are found. Skin Cancer  Check your skin from head to toe regularly.  Tell your health care provider about any new moles or changes in moles, especially if there is a change in a mole's shape or color.  Also tell your health care provider if you have a mole that is larger than the size of a pencil eraser.  Always use sunscreen. Apply sunscreen liberally and repeatedly throughout the day.  Protect yourself by wearing long sleeves, pants, a wide-brimmed hat, and sunglasses whenever you are outside. HEART DISEASE, DIABETES, AND HIGH BLOOD PRESSURE   Have your blood pressure checked at least every 1-2 years. High blood pressure causes heart disease and increases the risk of stroke.  If you are between 33 years and 64 years old, ask your health care provider if you should take aspirin to prevent strokes.  Have regular diabetes screenings. This involves taking a blood sample to check your fasting blood sugar level.  If you are at a normal weight and have a low risk for diabetes, have this test once every three years after 52 years of age.  If you are overweight and have a high risk for diabetes, consider being tested at a younger age or more often. PREVENTING INFECTION  Hepatitis B  If you have a higher risk for hepatitis B, you should be screened for this virus. You are considered at high risk for hepatitis B if:  You were born in a country where hepatitis B is common. Ask your health care provider which countries are considered high risk.  Your parents were born in a high-risk country, and you have not been immunized against hepatitis B (hepatitis B vaccine).  You have HIV or AIDS.  You use needles to inject street drugs.  You live with someone who has hepatitis B.  You have had sex with someone who has hepatitis B.  You get hemodialysis treatment.  You take certain medicines for conditions,  including cancer, organ transplantation, and autoimmune conditions. Hepatitis C  Blood testing is recommended for:  Everyone born from [birth year] through 1964.  Anyone with known risk factors for hepatitis C. Sexually transmitted infections (STIs)  You should be screened for sexually transmitted infections (STIs) including gonorrhea and chlamydia if:  You are sexually active and are younger than 52 years of age.  You are older  than 52 years of age and your health care provider tells you that you are at risk for this type of infection.  Your sexual activity has changed since you were last screened and you are at an increased risk for chlamydia or gonorrhea. Ask your health care provider if you are at risk.  If you do not have HIV, but are at risk, it may be recommended that you take a prescription medicine daily to prevent HIV infection. This is called pre-exposure prophylaxis (PrEP). You are considered at risk if:  You are sexually active and do not regularly use condoms or know the HIV status of your partner(s).  You take drugs by injection.  You are sexually active with a partner who has HIV. Talk with your health care provider about whether you are at high risk of being infected with HIV. If you choose to begin PrEP, you should first be tested for HIV. You should then be tested every 3 months for as long as you are taking PrEP.  PREGNANCY   If you are premenopausal and you may become pregnant, ask your health care provider about preconception counseling.  If you may become pregnant, take 400 to 800 micrograms (mcg) of folic acid every day.  If you want to prevent pregnancy, talk to your health care provider about birth control (contraception). OSTEOPOROSIS AND MENOPAUSE   Osteoporosis is a disease in which the bones lose minerals and strength with aging. This can result in serious bone fractures. Your risk for osteoporosis can be identified using a bone density scan.  If you are 73  years of age or older, or if you are at risk for osteoporosis and fractures, ask your health care provider if you should be screened.  Ask your health care provider whether you should take a calcium or vitamin D supplement to lower your risk for osteoporosis.  Menopause may have certain physical symptoms and risks.  Hormone replacement therapy may reduce some of these symptoms and risks. Talk to your health care provider about whether hormone replacement therapy is right for you.  HOME CARE INSTRUCTIONS   Schedule regular health, dental, and eye exams.  Stay current with your immunizations.   Do not use any tobacco products including cigarettes, chewing tobacco, or electronic cigarettes.  If you are pregnant, do not drink alcohol.  If you are breastfeeding, limit how much and how often you drink alcohol.  Limit alcohol intake to no more than 1 drink per day for nonpregnant women. One drink equals 12 ounces of beer, 5 ounces of wine, or 1 ounces of hard liquor.  Do not use street drugs.  Do not share needles.  Ask your health care provider for help if you need support or information about quitting drugs.  Tell your health care provider if you often feel depressed.  Tell your health care provider if you have ever been abused or do not feel safe at home. Document Released: 09/10/2010 Document Revised: 07/12/2013 Document Reviewed: 01/27/2013 [facility] Patient Information 2015 [place], [place]. This information is not intended to replace advice given to you by your health care provider. Make sure you discuss any questions you have with your health care provider.

## 2014-01-19 NOTE — Progress Notes (Signed)
Pre visit review using our clinic review tool, if applicable. No additional management support is needed unless otherwise documented below in the visit note. 

## 2014-01-24 ENCOUNTER — Ambulatory Visit
Admission: RE | Admit: 2014-01-24 | Discharge: 2014-01-24 | Disposition: A | Discharge status: Home / Self Care | Payer: Federal, State, Local not specified - PPO | Source: Ambulatory Visit | Attending: Internal Medicine | Admitting: Internal Medicine

## 2014-01-24 DIAGNOSIS — R590 Localized enlarged lymph nodes: Secondary | ICD-10-CM

## 2014-02-07 ENCOUNTER — Encounter: Payer: Self-pay | Admitting: Internal Medicine

## 2014-05-24 ENCOUNTER — Other Ambulatory Visit: Payer: Self-pay | Admitting: Obstetrics & Gynecology

## 2014-05-24 DIAGNOSIS — Z1231 Encounter for screening mammogram for malignant neoplasm of breast: Secondary | ICD-10-CM

## 2014-06-14 ENCOUNTER — Ambulatory Visit (HOSPITAL_COMMUNITY)
Admission: RE | Admit: 2014-06-14 | Discharge: 2014-06-14 | Disposition: A | Discharge status: Home / Self Care | Payer: Federal, State, Local not specified - PPO | Source: Ambulatory Visit | Attending: Obstetrics & Gynecology | Admitting: Obstetrics & Gynecology

## 2014-06-14 DIAGNOSIS — Z1231 Encounter for screening mammogram for malignant neoplasm of breast: Secondary | ICD-10-CM

## 2014-06-15 ENCOUNTER — Other Ambulatory Visit: Payer: Self-pay | Admitting: Obstetrics & Gynecology

## 2014-06-15 DIAGNOSIS — R234 Changes in skin texture: Secondary | ICD-10-CM

## 2014-06-21 ENCOUNTER — Encounter: Payer: Self-pay | Admitting: Internal Medicine

## 2014-06-21 ENCOUNTER — Ambulatory Visit (INDEPENDENT_AMBULATORY_CARE_PROVIDER_SITE_OTHER): Payer: Federal, State, Local not specified - PPO | Admitting: Internal Medicine

## 2014-06-21 VITALS — BP 118/76 | HR 77 | Temp 97.8°F | Resp 18 | Ht 65.5 in | Wt 140.0 lb

## 2014-06-21 DIAGNOSIS — J309 Allergic rhinitis, unspecified: Secondary | ICD-10-CM

## 2014-06-21 DIAGNOSIS — H6982 Other specified disorders of Eustachian tube, left ear: Secondary | ICD-10-CM

## 2014-06-21 DIAGNOSIS — H6980 Other specified disorders of Eustachian tube, unspecified ear: Secondary | ICD-10-CM | POA: Insufficient documentation

## 2014-06-21 DIAGNOSIS — J019 Acute sinusitis, unspecified: Secondary | ICD-10-CM | POA: Insufficient documentation

## 2014-06-21 DIAGNOSIS — J3089 Other allergic rhinitis: Secondary | ICD-10-CM | POA: Insufficient documentation

## 2014-06-21 DIAGNOSIS — J0110 Acute frontal sinusitis, unspecified: Secondary | ICD-10-CM | POA: Diagnosis not present

## 2014-06-21 DIAGNOSIS — J0100 Acute maxillary sinusitis, unspecified: Secondary | ICD-10-CM | POA: Insufficient documentation

## 2014-06-21 MED ORDER — LEVOFLOXACIN 500 MG PO TABS: 500.0000 mg | ORAL_TABLET | Freq: Every day | ORAL | Status: DC

## 2014-06-21 NOTE — Progress Notes (Signed)
   Subjective:    Patient ID: April Vaughn, female    DOB: October 22, 1960, 54 y.o.   MRN: [id number]  HPI   Here with 2-3 days acute onset fever, facial pain, pressure, headache, general weakness and malaise, and greenish d/c, with mild ST and cough, but pt denies chest pain, wheezing, increased sob or doe, orthopnea, PND, increased LE swelling, palpitations, dizziness or syncope.  Does have several wks ongoing nasal allergy symptoms with clearish congestion, itch and sneezing, without fever, pain, ST, cough, swelling or wheezing.  Has some left ear pressure and muffled sounds, mild dizzy and nauseas yesterday, had to leave work Past Medical History  Diagnosis Date  . Chloasma   . Diverticulosis 2012  . 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 History of PCR DNA positive for HSV1   . Anxiety   . Depression    Past Surgical History  Procedure Laterality Date  . Colonoscopy  09/07/2010  . Vericose vein Right 06/2011    reports that she has never smoked. She has never used smokeless tobacco. She reports that she drinks about 2.0 oz of alcohol per week. She reports that she does not use illicit drugs. family history includes Aneurysm in her mother; Colon cancer (age of onset: 7562) in her maternal grandfather; Coronary artery disease in her father; Coronary artery disease (age of onset: 7447) in her sister; Coronary artery disease (age of onset: 1365) in her mother; Crohn's disease (age of onset: 2542) in her sister; Fibromyalgia in her sister; Hypertension in her father; Kidney failure in her father. No Known Allergies Current Outpatient Prescriptions on File Prior to Visit  Medication Sig Dispense Refill  . estradiol (ESTRACE) 0.5 MG tablet Take 1 tablet (0.5 mg total) by mouth daily. 90 tablet 3  . Multiple Vitamin (MULTIVITAMIN) tablet Take 1 tablet by mouth daily.    . progesterone (PROMETRIUM) 200 MG capsule Take 1 capsule (200 mg total) by mouth daily. Pt takes only the 1st-10th of month 30 capsule 3  .  valACYclovir (VALTREX) 1000 MG tablet Take 1 tablet (1,000 mg total) by mouth as needed. 30 tablet 12   No current facility-administered medications on file prior to visit.   Review of Systems All otherwise neg per pt     Objective:   Physical Exam BP 118/76 mmHg  Pulse 77  Temp(Src) 97.8 F (36.6 C) (Oral)  Resp 18  Ht 5' 5.5" (1.664 m)  Wt 140 lb (63.504 kg)  BMI 22.93 kg/m2  SpO2 95%  LMP 04/12/2011 VS noted, mild ill Constitutional: Pt appears in no significant distress HENT: Head: NCAT.  Right Ear: External ear normal.  Left Ear: External ear normal.  Bilat tm's with mild erythema, left with possible post fluid.  Max sinus areas mild tender.  Pharynx with mild erythema, no exudate Eyes: . Pupils are equal, round, and reactive to light. Conjunctivae and EOM are normal Neck: Normal range of motion. Neck supple.  Cardiovascular: Normal rate and regular rhythm.   Pulmonary/Chest: Effort normal and breath sounds without rales or wheezing.  Neurological: Pt is alert. Not confused , motor grossly intact Skin: Skin is warm. No rash, no LE edema Psychiatric: Pt behavior is normal. No agitation.     Assessment & Plan:

## 2014-06-21 NOTE — Assessment & Plan Note (Signed)
Mild, For re-start flonase,  to f/u any worsening symptoms or concerns

## 2014-06-21 NOTE — Assessment & Plan Note (Signed)
Mild to mod, for antibx course,  to f/u any worsening symptoms or concerns 

## 2014-06-21 NOTE — Assessment & Plan Note (Signed)
Ok for mucinex otc bid prn 

## 2014-06-21 NOTE — Patient Instructions (Addendum)
Please take all new medication as prescribed - the antibiotic  You can also take Delsym OTC for cough, and/or Mucinex (or it's generic off brand) for congestion, and tylenol as needed for pain.  Please continue all other medications as before, including the flonase  Please have the pharmacy call with any other refills you may need.  Please keep your appointments with your specialists as you may have planned

## 2014-06-28 ENCOUNTER — Ambulatory Visit
Admission: RE | Admit: 2014-06-28 | Discharge: 2014-06-28 | Disposition: A | Discharge status: Home / Self Care | Payer: Federal, State, Local not specified - PPO | Source: Ambulatory Visit | Attending: Obstetrics & Gynecology | Admitting: Obstetrics & Gynecology

## 2014-06-28 DIAGNOSIS — R234 Changes in skin texture: Secondary | ICD-10-CM

## 2014-07-12 ENCOUNTER — Ambulatory Visit: Payer: Federal, State, Local not specified - PPO | Admitting: Nurse Practitioner

## 2014-08-10 ENCOUNTER — Encounter: Payer: Self-pay | Admitting: Internal Medicine

## 2014-09-05 ENCOUNTER — Encounter: Payer: Self-pay | Admitting: Nurse Practitioner

## 2014-09-05 ENCOUNTER — Ambulatory Visit (INDEPENDENT_AMBULATORY_CARE_PROVIDER_SITE_OTHER): Payer: Federal, State, Local not specified - PPO | Admitting: Nurse Practitioner

## 2014-09-05 VITALS — BP 116/80 | HR 64 | Ht 65.5 in | Wt 140.0 lb

## 2014-09-05 DIAGNOSIS — A609 Anogenital herpesviral infection, unspecified: Secondary | ICD-10-CM

## 2014-09-05 DIAGNOSIS — Z780 Asymptomatic menopausal state: Secondary | ICD-10-CM | POA: Diagnosis not present

## 2014-09-05 DIAGNOSIS — Z0000 Encounter for general adult medical examination without abnormal findings: Secondary | ICD-10-CM | POA: Diagnosis not present

## 2014-09-05 DIAGNOSIS — Z01419 Encounter for gynecological examination (general) (routine) without abnormal findings: Secondary | ICD-10-CM

## 2014-09-05 MED ORDER — PROGESTERONE MICRONIZED 200 MG PO CAPS: 200.0000 mg | ORAL_CAPSULE | Freq: Every day | ORAL | Status: DC

## 2014-09-05 MED ORDER — ESTRADIOL 0.5 MG PO TABS: 0.5000 mg | ORAL_TABLET | Freq: Every day | ORAL | Status: DC

## 2014-09-05 MED ORDER — VALACYCLOVIR HCL 1 G PO TABS: 1000.0000 mg | ORAL_TABLET | ORAL | Status: DC | PRN

## 2014-09-05 NOTE — Patient Instructions (Signed)

## 2014-09-05 NOTE — Progress Notes (Signed)
Patient ID: April Vaughn, female   DOB: 27-Sep-1960, 54 y.o.   MRN: [id number] 54 y.o. G0P0 Single  Caucasian Fe here for annual exam.  Doing well on HRT.  No vaginal spotting or bleeding.  Right cervical neck has been swollen and painful.  She saw PCP and had multiple labs and [place] which was all normal.  Then came back and went to the dentist and everything there was normal.  She was given antibiotic but before taking the symptoms went away. No recurrence since.  She is with the same partner for 2 years.  They have just been to the [place] and had a wonderful time. Rare outbreaks of HSV.  Patient's last menstrual period was 04/12/2011.          Sexually active: Yes.    The current method of family planning is none and same sex partner.  Exercising: Yes.    cardio and weights at least every other day Smoker:  no  Health Maintenance: Pap:  11/23/12, negative , neg HR HPV MMG:  06/28/14, 3D, Bi-Rads 1: negative Colonoscopy:  09/07/10, polyp, divertic, repeat in 5 years TDaP:  10/19/11 Labs:  01/2014 in EPIC   reports that she has never smoked. She has never used smokeless tobacco. She reports that she drinks about 2.0 oz of alcohol per week. She reports that she does not use illicit drugs.  Past Medical History  Diagnosis Date  . Chloasma   . Diverticulosis 2012  . 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 History of PCR DNA positive for HSV1   . Anxiety   . Depression     Past Surgical History  Procedure Laterality Date  . Colonoscopy  09/07/2010  . Vericose vein Right 06/2011    Current Outpatient Prescriptions  Medication Sig Dispense Refill  . estradiol (ESTRACE) 0.5 MG tablet Take 1 tablet (0.5 mg total) by mouth daily. 90 tablet 4  . Multiple Vitamin (MULTIVITAMIN) tablet Take 1 tablet by mouth daily.    . progesterone (PROMETRIUM) 200 MG capsule Take 1 capsule (200 mg total) by mouth daily. Pt takes only the 1st-10th of month 90 capsule 4  . valACYclovir (VALTREX) 1000 MG tablet  Take 1 tablet (1,000 mg total) by mouth as needed. 90 tablet 3   No current facility-administered medications for this visit.    Family History  Problem Relation Age of Onset  . Coronary artery disease Mother 34    CABG, died 41 due to MI  . Hypertension Father   . Coronary artery disease Father   . Kidney failure Father   . Colon cancer Maternal Grandfather 40  . Coronary artery disease Sister 69    MI, 7 stents  . Crohn's disease Sister 52  . Aneurysm Mother     behind eye  . Fibromyalgia Sister     ?RA vs FM    ROS:  Pertinent items are noted in HPI.  Otherwise, a comprehensive ROS was negative.  Exam:   BP 116/80 mmHg  Pulse 64  Ht 5' 5.5" (1.664 m)  Wt 140 lb (63.504 kg)  BMI 22.93 kg/m2  LMP 04/12/2011 Height: 5' 5.5" (166.4 cm) Ht Readings from Last 3 Encounters:  09/05/14 5' 5.5" (1.664 m)  06/21/14 5' 5.5" (1.664 m)  01/19/14 5' 5.5" (1.664 m)    General appearance: alert, cooperative and appears stated age Head: Normocephalic, without obvious abnormality, atraumatic Neck: no adenopathy, supple, symmetrical, trachea midline and thyroid normal to inspection and  palpation Lungs: clear to auscultation bilaterally Breasts: normal appearance, no masses or tenderness Heart: regular rate and rhythm Abdomen: soft, non-tender; no masses,  no organomegaly Extremities: extremities normal, atraumatic, no cyanosis or edema Skin: Skin color, texture, turgor normal. No rashes or lesions Lymph nodes: Cervical, supraclavicular, and axillary nodes normal. No abnormal inguinal nodes palpated Neurologic: Grossly normal   Pelvic: External genitalia:  no lesions              Urethra:  normal appearing urethra with no masses, tenderness or lesions              Bartholin's and Skene's: normal                 Vagina: normal appearing vagina with normal color and discharge, no lesions              Cervix: anteverted              Pap taken: Yes.   Bimanual Exam:  Uterus:   normal size, contour, position, consistency, mobility, non-tender              Adnexa: no mass, fullness, tenderness               Rectovaginal: Confirms               Anus:  normal sphincter tone, no lesions  Chaperone present:  yes  A:  Well Woman with normal exam  Postmenopausal on HRT since 06/2011 History of anxiety and depression  History of HSV    P:   Reviewed health and wellness pertinent to exam  Pap smear as above  Mammogram is due 06/2015  Refill on HRT - Estrace and Prometrium for a year  Counseled about risk of DVT, CVA, cancer, etc.  Refill on Valtrex for a year  Counseled on breast self exam, mammography screening, use and side effects of HRT, adequate intake of calcium and vitamin D, diet and exercise, Kegel's exercises return annually or prn  An After Visit Summary was printed and given to the patient.

## 2014-09-06 NOTE — Progress Notes (Signed)
Encounter reviewed by Dr. Brook Amundson C. Silva.  

## 2014-09-07 LAB — IPS PAP TEST WITH HPV

## 2014-09-15 ENCOUNTER — Encounter: Payer: Self-pay | Admitting: Nurse Practitioner

## 2015-01-23 ENCOUNTER — Ambulatory Visit (INDEPENDENT_AMBULATORY_CARE_PROVIDER_SITE_OTHER): Payer: Federal, State, Local not specified - PPO | Admitting: Internal Medicine

## 2015-01-23 ENCOUNTER — Encounter: Payer: Self-pay | Admitting: Internal Medicine

## 2015-01-23 ENCOUNTER — Other Ambulatory Visit (INDEPENDENT_AMBULATORY_CARE_PROVIDER_SITE_OTHER): Payer: Federal, State, Local not specified - PPO

## 2015-01-23 VITALS — BP 104/68 | HR 67 | Temp 98.4°F | Ht 65.5 in | Wt 140.5 lb

## 2015-01-23 DIAGNOSIS — J302 Other seasonal allergic rhinitis: Secondary | ICD-10-CM

## 2015-01-23 DIAGNOSIS — Z23 Encounter for immunization: Secondary | ICD-10-CM

## 2015-01-23 DIAGNOSIS — Z1159 Encounter for screening for other viral diseases: Secondary | ICD-10-CM

## 2015-01-23 DIAGNOSIS — Z0000 Encounter for general adult medical examination without abnormal findings: Secondary | ICD-10-CM

## 2015-01-23 LAB — URINALYSIS, ROUTINE W REFLEX MICROSCOPIC
Bilirubin Urine: NEGATIVE
Hgb urine dipstick: NEGATIVE
Ketones, ur: NEGATIVE
Leukocytes, UA: NEGATIVE
Nitrite: NEGATIVE
PH: 6 (ref 5.0–8.0)
RBC / HPF: NONE SEEN (ref 0–?)
SPECIFIC GRAVITY, URINE: 1.02 (ref 1.000–1.030)
TOTAL PROTEIN, URINE-UPE24: NEGATIVE
Urine Glucose: NEGATIVE
Urobilinogen, UA: 0.2 (ref 0.0–1.0)

## 2015-01-23 LAB — LIPID PANEL
Cholesterol: 214 mg/dL — ABNORMAL HIGH (ref 0–200)
HDL: 77.4 mg/dL (ref >39.00)
LDL CALC: 113 mg/dL — AB (ref 0–99)
NONHDL: 136.9
Total CHOL/HDL Ratio: 3
Triglycerides: 122 mg/dL (ref 0.0–149.0)
VLDL: 24.4 mg/dL (ref 0.0–40.0)

## 2015-01-23 LAB — CBC WITH DIFFERENTIAL/PLATELET
BASOS PCT: 0.8 % (ref 0.0–3.0)
Basophils Absolute: 0.1 10*3/uL (ref 0.0–0.1)
Eosinophils Absolute: 0.3 10*3/uL (ref 0.0–0.7)
Eosinophils Relative: 4.4 % (ref 0.0–5.0)
HEMATOCRIT: 41.2 % (ref 36.0–46.0)
HEMOGLOBIN: 13.9 g/dL (ref 12.0–15.0)
LYMPHS PCT: 27.7 % (ref 12.0–46.0)
Lymphs Abs: 1.8 10*3/uL (ref 0.7–4.0)
MCHC: 33.8 g/dL (ref 30.0–36.0)
MCV: 92.8 fl (ref 78.0–100.0)
MONOS PCT: 9.6 % (ref 3.0–12.0)
Monocytes Absolute: 0.6 10*3/uL (ref 0.1–1.0)
NEUTROS ABS: 3.8 10*3/uL (ref 1.4–7.7)
Neutrophils Relative %: 57.5 % (ref 43.0–77.0)
PLATELETS: 340 10*3/uL (ref 150.0–400.0)
RBC: 4.44 Mil/uL (ref 3.87–5.11)
RDW: 13.4 % (ref 11.5–15.5)
WBC: 6.6 10*3/uL (ref 4.0–10.5)

## 2015-01-23 LAB — HEPATIC FUNCTION PANEL
ALT: 17 U/L (ref 0–35)
AST: 16 U/L (ref 0–37)
Albumin: 4.4 g/dL (ref 3.5–5.2)
Alkaline Phosphatase: 52 U/L (ref 39–117)
Bilirubin, Direct: 0.1 mg/dL (ref 0.0–0.3)
TOTAL PROTEIN: 7.6 g/dL (ref 6.0–8.3)
Total Bilirubin: 0.6 mg/dL (ref 0.2–1.2)

## 2015-01-23 LAB — BASIC METABOLIC PANEL
BUN: 14 mg/dL (ref 6–23)
CHLORIDE: 103 meq/L (ref 96–112)
CO2: 27 meq/L (ref 19–32)
Calcium: 9.2 mg/dL (ref 8.4–10.5)
Creatinine, Ser: 0.73 mg/dL (ref 0.40–1.20)
GFR: 88.62 mL/min (ref >60.00)
Glucose, Bld: 92 mg/dL (ref 70–99)
POTASSIUM: 4.1 meq/L (ref 3.5–5.1)
SODIUM: 137 meq/L (ref 135–145)

## 2015-01-23 LAB — TSH: TSH: 1.08 u[IU]/mL (ref 0.35–4.50)

## 2015-01-23 LAB — HEPATITIS C ANTIBODY: HCV Ab: NEGATIVE

## 2015-01-23 NOTE — Patient Instructions (Addendum)
It was good to see you today.  We have reviewed your prior records including labs and tests today  Annual flu shot administered today. Other Health Maintenance reviewed - all recommended immunizations and age-appropriate screenings are up-to-date.  Test(s) ordered today. Your results will be released to [place] (or called to you) after review, usually within 72hours after test completion. If any changes need to be made, you will be notified at that same time.  Medications reviewed and updated, no changes recommended at this time.  Please schedule followup in 12 months for annual exam and labs with Dr. Sharlet Salina, call sooner if problems.  Health Maintenance, Female Adopting a healthy lifestyle and getting preventive care can go a long way to promote health and wellness. Talk with your health care provider about what schedule of regular examinations is right for you. This is a good chance for you to check in with your provider about disease prevention and staying healthy. In between checkups, there are plenty of things you can do on your own. Experts have done a lot of research about which lifestyle changes and preventive measures are most likely to keep you healthy. Ask your health care provider for more information. WEIGHT AND DIET  Eat a healthy diet  Be sure to include plenty of vegetables, fruits, low-fat dairy products, and lean protein.  Do not eat a lot of foods high in solid fats, added sugars, or salt.  Get regular exercise. This is one of the most important things you can do for your health.  Most adults should exercise for at least 150 minutes each week. The exercise should increase your heart rate and make you sweat (moderate-intensity exercise).  Most adults should also do strengthening exercises at least twice a week. This is in addition to the moderate-intensity exercise.  Maintain a healthy weight  Body mass index (BMI) is a measurement that can be used to identify  possible weight problems. It estimates body fat based on height and weight. Your health care provider can help determine your BMI and help you achieve or maintain a healthy weight.  For females 37 years of age and older:   A BMI below 18.5 is considered underweight.  A BMI of 18.5 to 24.9 is normal.  A BMI of 25 to 29.9 is considered overweight.  A BMI of 30 and above is considered obese.  Watch levels of cholesterol and blood lipids  You should start having your blood tested for lipids and cholesterol at 53 years of age, then have this test every 5 years.  You may need to have your cholesterol levels checked more often if:  Your lipid or cholesterol levels are high.  You are older than 53 years of age.  You are at high risk for heart disease.  CANCER SCREENING   Lung Cancer  Lung cancer screening is recommended for adults 23-41 years old who are at high risk for lung cancer because of a history of smoking.  A yearly low-dose CT scan of the lungs is recommended for people who:  Currently smoke.  Have quit within the past 15 years.  Have at least a 30-pack-year history of smoking. A pack year is smoking an average of one pack of cigarettes a day for 1 year.  Yearly screening should continue until it has been 15 years since you quit.  Yearly screening should stop if you develop a health problem that would prevent you from having lung cancer treatment.  Breast Cancer  Practice breast  self-awareness. This means understanding how your breasts normally appear and feel.  It also means doing regular breast self-exams. Let your health care provider know about any changes, no matter how small.  If you are in your 20s or 30s, you should have a clinical breast exam (CBE) by a health care provider every 1-3 years as part of a regular health exam.  If you are 11 or older, have a CBE every year. Also consider having a breast X-ray (mammogram) every year.  If you have a family  history of breast cancer, talk to your health care provider about genetic screening.  If you are at high risk for breast cancer, talk to your health care provider about having an MRI and a mammogram every year.  Breast cancer gene (BRCA) assessment is recommended for women who have family members with BRCA-related cancers. BRCA-related cancers include:  Breast.  Ovarian.  Tubal.  Peritoneal cancers.  Results of the assessment will determine the need for genetic counseling and BRCA1 and BRCA2 testing. Cervical Cancer Your health care provider may recommend that you be screened regularly for cancer of the pelvic organs (ovaries, uterus, and vagina). This screening involves a pelvic examination, including checking for microscopic changes to the surface of your cervix (Pap test). You may be encouraged to have this screening done every 3 years, beginning at age 58.  For women ages 33-65, health care providers may recommend pelvic exams and Pap testing every 3 years, or they may recommend the Pap and pelvic exam, combined with testing for human papilloma virus (HPV), every 5 years. Some types of HPV increase your risk of cervical cancer. Testing for HPV may also be done on women of any age with unclear Pap test results.  Other health care providers may not recommend any screening for nonpregnant women who are considered low risk for pelvic cancer and who do not have symptoms. Ask your health care provider if a screening pelvic exam is right for you.  If you have had past treatment for cervical cancer or a condition that could lead to cancer, you need Pap tests and screening for cancer for at least 20 years after your treatment. If Pap tests have been discontinued, your risk factors (such as having a new sexual partner) need to be reassessed to determine if screening should resume. Some women have medical problems that increase the chance of getting cervical cancer. In these cases, your health care  provider may recommend more frequent screening and Pap tests. Colorectal Cancer  This type of cancer can be detected and often prevented.  Routine colorectal cancer screening usually begins at 53 years of age and continues through 53 years of age.  Your health care provider may recommend screening at an earlier age if you have risk factors for colon cancer.  Your health care provider may also recommend using home test kits to check for hidden blood in the stool.  A small camera at the end of a tube can be used to examine your colon directly (sigmoidoscopy or colonoscopy). This is done to check for the earliest forms of colorectal cancer.  Routine screening usually begins at age 65.  Direct examination of the colon should be repeated every 5-10 years through 53 years of age. However, you may need to be screened more often if early forms of precancerous polyps or small growths are found. Skin Cancer  Check your skin from head to toe regularly.  Tell your health care provider about any new moles  or changes in moles, especially if there is a change in a mole's shape or color.  Also tell your health care provider if you have a mole that is larger than the size of a pencil eraser.  Always use sunscreen. Apply sunscreen liberally and repeatedly throughout the day.  Protect yourself by wearing long sleeves, pants, a wide-brimmed hat, and sunglasses whenever you are outside. HEART DISEASE, DIABETES, AND HIGH BLOOD PRESSURE   High blood pressure causes heart disease and increases the risk of stroke. High blood pressure is more likely to develop in:  People who have blood pressure in the high end of the normal range (130-139/85-89 mm Hg).  People who are overweight or obese.  People who are African American.  If you are 52-66 years of age, have your blood pressure checked every 3-5 years. If you are 56 years of age or older, have your blood pressure checked every year. You should have your  blood pressure measured twice--once when you are at a hospital or clinic, and once when you are not at a hospital or clinic. Record the average of the two measurements. To check your blood pressure when you are not at a hospital or clinic, you can use:  An automated blood pressure machine at a pharmacy.  A home blood pressure monitor.  If you are between 58 years and 91 years old, ask your health care provider if you should take aspirin to prevent strokes.  Have regular diabetes screenings. This involves taking a blood sample to check your fasting blood sugar level.  If you are at a normal weight and have a low risk for diabetes, have this test once every three years after 53 years of age.  If you are overweight and have a high risk for diabetes, consider being tested at a younger age or more often. PREVENTING INFECTION  Hepatitis B  If you have a higher risk for hepatitis B, you should be screened for this virus. You are considered at high risk for hepatitis B if:  You were born in a country where hepatitis B is common. Ask your health care provider which countries are considered high risk.  Your parents were born in a high-risk country, and you have not been immunized against hepatitis B (hepatitis B vaccine).  You have HIV or AIDS.  You use needles to inject street drugs.  You live with someone who has hepatitis B.  You have had sex with someone who has hepatitis B.  You get hemodialysis treatment.  You take certain medicines for conditions, including cancer, organ transplantation, and autoimmune conditions. Hepatitis C  Blood testing is recommended for:  Everyone born from [birth year] through 1964.  Anyone with known risk factors for hepatitis C. Sexually transmitted infections (STIs)  You should be screened for sexually transmitted infections (STIs) including gonorrhea and chlamydia if:  You are sexually active and are younger than 53 years of age.  You are older than 53  years of age and your health care provider tells you that you are at risk for this type of infection.  Your sexual activity has changed since you were last screened and you are at an increased risk for chlamydia or gonorrhea. Ask your health care provider if you are at risk.  If you do not have HIV, but are at risk, it may be recommended that you take a prescription medicine daily to prevent HIV infection. This is called pre-exposure prophylaxis (PrEP). You are considered at risk if:  You are sexually active and do not regularly use condoms or know the HIV status of your partner(s).  You take drugs by injection.  You are sexually active with a partner who has HIV. Talk with your health care provider about whether you are at high risk of being infected with HIV. If you choose to begin PrEP, you should first be tested for HIV. You should then be tested every 3 months for as long as you are taking PrEP.  PREGNANCY   If you are premenopausal and you may become pregnant, ask your health care provider about preconception counseling.  If you may become pregnant, take 400 to 800 micrograms (mcg) of folic acid every day.  If you want to prevent pregnancy, talk to your health care provider about birth control (contraception). OSTEOPOROSIS AND MENOPAUSE   Osteoporosis is a disease in which the bones lose minerals and strength with aging. This can result in serious bone fractures. Your risk for osteoporosis can be identified using a bone density scan.  If you are 46 years of age or older, or if you are at risk for osteoporosis and fractures, ask your health care provider if you should be screened.  Ask your health care provider whether you should take a calcium or vitamin D supplement to lower your risk for osteoporosis.  Menopause may have certain physical symptoms and risks.  Hormone replacement therapy may reduce some of these symptoms and risks. Talk to your health care provider about whether  hormone replacement therapy is right for you.  HOME CARE INSTRUCTIONS   Schedule regular health, dental, and eye exams.  Stay current with your immunizations.   Do not use any tobacco products including cigarettes, chewing tobacco, or electronic cigarettes.  If you are pregnant, do not drink alcohol.  If you are breastfeeding, limit how much and how often you drink alcohol.  Limit alcohol intake to no more than 1 drink per day for nonpregnant women. One drink equals 12 ounces of beer, 5 ounces of wine, or 1 ounces of hard liquor.  Do not use street drugs.  Do not share needles.  Ask your health care provider for help if you need support or information about quitting drugs.  Tell your health care provider if you often feel depressed.  Tell your health care provider if you have ever been abused or do not feel safe at home.   This information is not intended to replace advice given to you by your health care provider. Make sure you discuss any questions you have with your health care provider.   Document Released: 09/10/2010 Document Revised: 03/18/2014 Document Reviewed: 01/27/2013 Elsevier Interactive Patient Education [employer].

## 2015-01-23 NOTE — Progress Notes (Signed)
Subjective:    Patient ID: April Vaughn, female    DOB: Apr 24, 1960, 54 y.o.   MRN: [id number]  HPI  patient is here today for annual physical. Patient feels well and has no complaints. Also reviewed chronic medical conditions, interval events and current concerns  Past Medical History  Diagnosis Date  . Chloasma   . Diverticulosis 2012  . 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 History of PCR DNA positive for HSV1   . Anxiety   . Depression    Family History  Problem Relation Age of Onset  . Coronary artery disease Mother 81    CABG, died 33 due to MI  . Hypertension Father   . Coronary artery disease Father     CABG  . Kidney failure Father   . Colon cancer Maternal Grandfather 50  . Coronary artery disease Sister 33    MI, 9 stents  . Crohn's disease Sister 40  . Aneurysm Mother     behind eye  . Fibromyalgia Sister     ?RA vs FM  . Hyperlipidemia Sister    Social History  Substance Use Topics  . Smoking status: Never Smoker   . Smokeless tobacco: Never Used  . Alcohol Use: 2.0 oz/week    4 drink(s) per week    Review of Systems  Constitutional: Negative for fatigue and unexpected weight change.  Respiratory: Negative for cough, shortness of breath and wheezing.   Cardiovascular: Negative for chest pain, palpitations and leg swelling.  Gastrointestinal: Positive for diarrhea (episodic events). Negative for nausea, abdominal pain and blood in stool.  Neurological: Negative for dizziness, weakness, light-headedness and headaches.  Psychiatric/Behavioral: Negative for dysphoric mood. The patient is not nervous/anxious.   All other systems reviewed and are negative.      Objective:    Physical Exam  Constitutional: She is oriented to person, place, and time. She appears well-developed and well-nourished. No distress.  HENT:  Head: Normocephalic and atraumatic.  Right Ear: External ear normal.  Left Ear: External ear normal.  Nose: Nose normal.    Mouth/Throat: Oropharynx is clear and moist. No oropharyngeal exudate.  Eyes: EOM are normal. Pupils are equal, round, and reactive to light. Right eye exhibits no discharge. Left eye exhibits no discharge. No scleral icterus.  Neck: Normal range of motion. Neck supple. No JVD present. No tracheal deviation present. No thyromegaly present.  Cardiovascular: Normal rate, regular rhythm, normal heart sounds and intact distal pulses.  Exam reveals no friction rub.   No murmur heard. Pulmonary/Chest: Effort normal and breath sounds normal. No respiratory distress. She has no wheezes. She has no rales. She exhibits no tenderness.  Abdominal: Soft. Bowel sounds are normal. She exhibits no distension and no mass. There is no tenderness. There is no rebound and no guarding.  Genitourinary:  Defer to gyn  Musculoskeletal: Normal range of motion.  No gross deformities  Lymphadenopathy:    She has no cervical adenopathy.  Neurological: She is alert and oriented to person, place, and time. She has normal reflexes. No cranial nerve deficit.  Skin: Skin is warm and dry. No rash noted. She is not diaphoretic. No erythema.  Psychiatric: She has a normal mood and affect. Her behavior is normal. Judgment and thought content normal.  Nursing note and vitals reviewed.   BP 104/68 mmHg  Pulse 67  Temp(Src) 98.4 F (36.9 C) (Oral)  Ht 5' 5.5" (1.664 m)  Wt 140 lb 8 oz (63.73 kg)  BMI 23.02 kg/m2  SpO2 97%  LMP 04/12/2011 Wt Readings from Last 3 Encounters:  01/23/15 140 lb 8 oz (63.73 kg)  09/05/14 140 lb (63.504 kg)  06/21/14 140 lb (63.504 kg)    Lab Results  Component Value Date   WBC 8.7 01/19/2014   HGB 14.0 01/19/2014   HCT 42.5 01/19/2014   PLT 377.0 01/19/2014   GLUCOSE 93 01/19/2014   CHOL 217* 01/19/2014   TRIG 132.0 01/19/2014   HDL 61.60 01/19/2014   LDLDIRECT 135.9 11/20/2012   LDLCALC 129* 01/19/2014   ALT 19 01/19/2014   AST 19 01/19/2014   NA 139 01/19/2014   K 4.5  01/19/2014   CL 103 01/19/2014   CREATININE 0.8 01/19/2014   BUN 12 01/19/2014   CO2 23 01/19/2014   TSH 0.87 01/19/2014    US Breast Ltd Uni Left Inc Axilla  06/28/2014  CLINICAL DATA:  Patient with complaints of left upper outer quadrant breast tenderness as well as a "lazy" nipple, which she describes as drooping but not inverted or retracted. EXAM: DIGITAL DIAGNOSTIC BILATERAL MAMMOGRAM WITH 3D TOMOSYNTHESIS WITH CAD ULTRASOUND LEFT BREAST COMPARISON:  Prior exams ACR Breast Density Category c: The breast tissue is heterogeneously dense, which may obscure small masses. FINDINGS: There are no discrete masses, areas of architectural distortion, areas of significant asymmetry or suspicious calcifications. There has been no mammographic change. Mammographic images were processed with CAD. On physical exam, no mass is palpated in the upper outer left breast. There is no visible abnormality of the nipple. Targeted ultrasound is performed, showing normal fibroglandular tissue. No mass or cyst. IMPRESSION: Normal exam.  No evidence of malignancy. RECOMMENDATION: Screening mammogram in one year.(Code:SM-B-01Y) I have discussed the findings and recommendations with the patient. Results were also provided in writing at the conclusion of the visit. If applicable, a reminder letter will be sent to the patient regarding the next appointment. BI-RADS CATEGORY  1: Negative. Electronically Signed   By: Amie Portland M.D.   On: 06/28/2014 08:48   Mm Diag Breast Tomo Bilateral  06/28/2014  CLINICAL DATA:  Patient with complaints of left upper outer quadrant breast tenderness as well as a "lazy" nipple, which she describes as drooping but not inverted or retracted. EXAM: DIGITAL DIAGNOSTIC BILATERAL MAMMOGRAM WITH 3D TOMOSYNTHESIS WITH CAD ULTRASOUND LEFT BREAST COMPARISON:  Prior exams ACR Breast Density Category c: The breast tissue is heterogeneously dense, which may obscure small masses. FINDINGS: There are no  discrete masses, areas of architectural distortion, areas of significant asymmetry or suspicious calcifications. There has been no mammographic change. Mammographic images were processed with CAD. On physical exam, no mass is palpated in the upper outer left breast. There is no visible abnormality of the nipple. Targeted ultrasound is performed, showing normal fibroglandular tissue. No mass or cyst. IMPRESSION: Normal exam.  No evidence of malignancy. RECOMMENDATION: Screening mammogram in one year.(Code:SM-B-01Y) I have discussed the findings and recommendations with the patient. Results were also provided in writing at the conclusion of the visit. If applicable, a reminder letter will be sent to the patient regarding the next appointment. BI-RADS CATEGORY  1: Negative. Electronically Signed   By: Amie Portland M.D.   On: 06/28/2014 08:48       Assessment & Plan:   CPX/z00.00 - Patient has been counseled on age-appropriate routine health concerns for screening and prevention. These are reviewed and up-to-date. Immunizations are up-to-date or declined. Labs ordered and reviewed.  Problem List Items Addressed This Visit  None    Visit Diagnoses    Routine general medical examination at a health care facility    -  Primary    Relevant Orders    Basic metabolic panel    CBC with Differential/Platelet    Hepatic function panel    Lipid panel    TSH    Urinalysis, Routine w reflex microscopic ([facility])    Seasonal allergic rhinitis        Relevant Orders    [place] allergy panel    Need for hepatitis C screening test        Relevant Orders    Hepatitis C antibody        Rene PaciValerie Dayne Dekay, MD

## 2015-01-23 NOTE — Progress Notes (Signed)
Pre visit review using our clinic review tool, if applicable. No additional management support is needed unless otherwise documented below in the visit note. 

## 2015-01-24 LAB — ~~LOC~~ ALLERGY PANEL
Allergen, D pternoyssinus,d7: <0.1 kU/L
Allergen, Mulberry, t76: <0.1 kU/L
Aspergillus fumigatus, m3: <0.1 kU/L
Bermuda Grass: <0.1 kU/L
Cat Dander: <0.1 kU/L
Cladosporium Herbarum: <0.1 kU/L
D. farinae: <0.1 kU/L
Dog Dander: <0.1 kU/L
Elm IgE: <0.1 kU/L
Johnson Grass: <0.1 kU/L
Mucor Racemosus: <0.1 kU/L
Oak: <0.1 kU/L
Pecan/Hickory Tree IgE: <0.1 kU/L
Penicillium Notatum: <0.1 kU/L
Sweet Gum: <0.1 kU/L
Timothy Grass: <0.1 kU/L

## 2015-01-31 ENCOUNTER — Encounter: Payer: Self-pay | Admitting: Internal Medicine

## 2015-01-31 NOTE — Telephone Encounter (Signed)
She can buy the wrist splints as it sounds like carpal tunnel. She can wear at night time for help.

## 2015-02-06 NOTE — Telephone Encounter (Signed)
LVM for pt to call back as soon as possible.   

## 2015-03-21 ENCOUNTER — Encounter: Payer: Self-pay | Admitting: Internal Medicine

## 2015-03-21 ENCOUNTER — Ambulatory Visit (INDEPENDENT_AMBULATORY_CARE_PROVIDER_SITE_OTHER): Payer: Federal, State, Local not specified - PPO | Admitting: Internal Medicine

## 2015-03-21 VITALS — BP 98/62 | HR 86 | Temp 98.6°F | Wt 143.0 lb

## 2015-03-21 DIAGNOSIS — H6693 Otitis media, unspecified, bilateral: Secondary | ICD-10-CM | POA: Diagnosis not present

## 2015-03-21 DIAGNOSIS — H6690 Otitis media, unspecified, unspecified ear: Secondary | ICD-10-CM | POA: Insufficient documentation

## 2015-03-21 MED ORDER — AZITHROMYCIN 250 MG PO TABS: ORAL_TABLET | ORAL | Status: DC

## 2015-03-21 NOTE — Progress Notes (Signed)
Pre visit review using our clinic review tool, if applicable. No additional management support is needed unless otherwise documented below in the visit note. 

## 2015-03-21 NOTE — Progress Notes (Signed)
Subjective:  Patient ID: April Vaughn, female    DOB: [birth year]  Age: 55 y.o. MRN: [id number]  CC: Sinusitis and Ear Pain   HPI April Vaughn presents for a URI sx's and pain in B ears; green nasal d/c  Outpatient Prescriptions Prior to Visit  Medication Sig Dispense Refill  . estradiol (ESTRACE) 0.5 MG tablet Take 1 tablet (0.5 mg total) by mouth daily. 90 tablet 4  . Multiple Vitamin (MULTIVITAMIN) tablet Take 1 tablet by mouth daily.    . progesterone (PROMETRIUM) 200 MG capsule Take 1 capsule (200 mg total) by mouth daily. Pt takes only the 1st-10th of month 90 capsule 4  . valACYclovir (VALTREX) 1000 MG tablet Take 1 tablet (1,000 mg total) by mouth as needed. 90 tablet 3   No facility-administered medications prior to visit.    ROS Review of Systems  Constitutional: Negative for chills, activity change, appetite change, fatigue and unexpected weight change.  HENT: Positive for congestion, ear pain, rhinorrhea and sore throat. Negative for mouth sores and sinus pressure.   Eyes: Negative for visual disturbance.  Respiratory: Negative for cough and chest tightness.   Gastrointestinal: Negative for nausea and abdominal pain.  Genitourinary: Negative for frequency, difficulty urinating and vaginal pain.  Musculoskeletal: Negative for back pain and gait problem.  Skin: Negative for pallor and rash.  Neurological: Negative for dizziness, tremors, weakness, numbness and headaches.  Psychiatric/Behavioral: Negative for confusion and sleep disturbance.    Objective:  BP 98/62 mmHg  Pulse 86  Temp(Src) 98.6 F (37 C) (Oral)  Wt 143 lb (64.864 kg)  SpO2 97%  LMP 04/12/2011  BP Readings from Last 3 Encounters:  03/21/15 98/62  01/23/15 104/68  09/05/14 116/80    Wt Readings from Last 3 Encounters:  03/21/15 143 lb (64.864 kg)  01/23/15 140 lb 8 oz (63.73 kg)  09/05/14 140 lb (63.504 kg)    Physical Exam  Constitutional: She appears well-developed. No distress.    HENT:  Head: Normocephalic.  Right Ear: External ear normal.  Left Ear: External ear normal.  Nose: Nose normal.  Mouth/Throat: Oropharynx is clear and moist.  Eyes: Conjunctivae are normal. Pupils are equal, round, and reactive to light. Right eye exhibits no discharge. Left eye exhibits no discharge.  Neck: Normal range of motion. Neck supple. No JVD present. No tracheal deviation present. No thyromegaly present.  Cardiovascular: Normal rate, regular rhythm and normal heart sounds.   Pulmonary/Chest: No stridor. No respiratory distress. She has no wheezes.  Abdominal: Soft. Bowel sounds are normal. She exhibits no distension and no mass. There is no tenderness. There is no rebound and no guarding.  Musculoskeletal: She exhibits no edema or tenderness.  Lymphadenopathy:    She has no cervical adenopathy.  Neurological: She displays normal reflexes. No cranial nerve deficit. She exhibits normal muscle tone. Coordination normal.  Skin: No rash noted. No erythema.  Psychiatric: She has a normal mood and affect. Her behavior is normal. Judgment and thought content normal.  R TM red>L eryth throat  Lab Results  Component Value Date   WBC 6.6 01/23/2015   HGB 13.9 01/23/2015   HCT 41.2 01/23/2015   PLT 340.0 01/23/2015   GLUCOSE 92 01/23/2015   CHOL 214* 01/23/2015   TRIG 122.0 01/23/2015   HDL 77.40 01/23/2015   LDLDIRECT 135.9 11/20/2012   LDLCALC 113* 01/23/2015   ALT 17 01/23/2015   AST 16 01/23/2015   NA 137 01/23/2015   K 4.1 01/23/2015  CL 103 01/23/2015   CREATININE 0.73 01/23/2015   BUN 14 01/23/2015   CO2 27 01/23/2015   TSH 1.08 01/23/2015    [place] Breast Ltd Uni Left Inc Axilla  06/28/2014  CLINICAL DATA:  Patient with complaints of left upper outer quadrant breast tenderness as well as a "lazy" nipple, which she describes as drooping but not inverted or retracted. EXAM: DIGITAL DIAGNOSTIC BILATERAL MAMMOGRAM WITH 3D TOMOSYNTHESIS WITH CAD ULTRASOUND LEFT BREAST  COMPARISON:  Prior exams ACR Breast Density Category c: The breast tissue is heterogeneously dense, which may obscure small masses. FINDINGS: There are no discrete masses, areas of architectural distortion, areas of significant asymmetry or suspicious calcifications. There has been no mammographic change. Mammographic images were processed with CAD. On physical exam, no mass is palpated in the upper outer left breast. There is no visible abnormality of the nipple. Targeted ultrasound is performed, showing normal fibroglandular tissue. No mass or cyst. IMPRESSION: Normal exam.  No evidence of malignancy. RECOMMENDATION: Screening mammogram in one year.(Code:SM-B-01Y) I have discussed the findings and recommendations with the patient. Results were also provided in writing at the conclusion of the visit. If applicable, a reminder letter will be sent to the patient regarding the next appointment. BI-RADS CATEGORY  1: Negative. Electronically Signed   By: Amie Portlandavid  Ormond M.D.   On: 06/28/2014 08:48   Mm Diag Breast Tomo Bilateral  06/28/2014  CLINICAL DATA:  Patient with complaints of left upper outer quadrant breast tenderness as well as a "lazy" nipple, which she describes as drooping but not inverted or retracted. EXAM: DIGITAL DIAGNOSTIC BILATERAL MAMMOGRAM WITH 3D TOMOSYNTHESIS WITH CAD ULTRASOUND LEFT BREAST COMPARISON:  Prior exams ACR Breast Density Category c: The breast tissue is heterogeneously dense, which may obscure small masses. FINDINGS: There are no discrete masses, areas of architectural distortion, areas of significant asymmetry or suspicious calcifications. There has been no mammographic change. Mammographic images were processed with CAD. On physical exam, no mass is palpated in the upper outer left breast. There is no visible abnormality of the nipple. Targeted ultrasound is performed, showing normal fibroglandular tissue. No mass or cyst. IMPRESSION: Normal exam.  No evidence of malignancy.  RECOMMENDATION: Screening mammogram in one year.(Code:SM-B-01Y) I have discussed the findings and recommendations with the patient. Results were also provided in writing at the conclusion of the visit. If applicable, a reminder letter will be sent to the patient regarding the next appointment. BI-RADS CATEGORY  1: Negative. Electronically Signed   By: Amie Portlandavid  Ormond M.D.   On: 06/28/2014 08:48    Assessment & Plan:   Stark BrayLynda was seen today for sinusitis and ear pain.  Diagnoses and all orders for this visit:  Bilateral acute otitis media, recurrence not specified, unspecified otitis media type  Other orders -     azithromycin (ZITHROMAX) 250 MG tablet; As directed  I am having Ms. Bradt start on azithromycin. I am also having her maintain her multivitamin, estradiol, progesterone, and valACYclovir.  Meds ordered this encounter  Medications  . azithromycin (ZITHROMAX) 250 MG tablet    Sig: As directed    Dispense:  6 tablet    Refill:  0     Follow-up: No Follow-up on file.  Sonda PrimesAlex Billyjack Trompeter, MD

## 2015-03-21 NOTE — Patient Instructions (Signed)
Use over-the-counter  "cold" medicines  such as "Tylenol cold" , "Advil cold",  "Mucinex" or" Mucinex D"  for cough and congestion.   Avoid decongestants if you have high blood pressure and use "Afrin" nasal spray for nasal congestion as directed instead. Use" Delsym" or" Robitussin" cough syrup varietis for cough.  You can use plain "Tylenol" or "Advil" for fever, chills and achyness. Use Halls or Ricola cough drops.  Please, make an appointment if you are not better or if you're worse.  

## 2015-03-21 NOTE — Assessment & Plan Note (Signed)
Zpac 

## 2015-05-23 ENCOUNTER — Encounter: Payer: Self-pay | Admitting: Internal Medicine

## 2015-05-23 NOTE — Telephone Encounter (Signed)
Would need to see and evaluate for recommendations.

## 2015-05-25 ENCOUNTER — Encounter: Payer: Self-pay | Admitting: Internal Medicine

## 2015-05-25 ENCOUNTER — Other Ambulatory Visit (INDEPENDENT_AMBULATORY_CARE_PROVIDER_SITE_OTHER): Payer: Federal, State, Local not specified - PPO

## 2015-05-25 ENCOUNTER — Ambulatory Visit (INDEPENDENT_AMBULATORY_CARE_PROVIDER_SITE_OTHER): Payer: Federal, State, Local not specified - PPO | Admitting: Internal Medicine

## 2015-05-25 VITALS — BP 102/72 | HR 64 | Temp 98.3°F | Wt 141.0 lb

## 2015-05-25 DIAGNOSIS — R0609 Other forms of dyspnea: Secondary | ICD-10-CM

## 2015-05-25 DIAGNOSIS — R590 Localized enlarged lymph nodes: Secondary | ICD-10-CM | POA: Diagnosis not present

## 2015-05-25 DIAGNOSIS — J3489 Other specified disorders of nose and nasal sinuses: Secondary | ICD-10-CM | POA: Diagnosis not present

## 2015-05-25 DIAGNOSIS — R0600 Dyspnea, unspecified: Secondary | ICD-10-CM

## 2015-05-25 LAB — CBC WITH DIFFERENTIAL/PLATELET
BASOS ABS: 0 10*3/uL (ref 0.0–0.1)
Basophils Relative: 0.4 % (ref 0.0–3.0)
Eosinophils Absolute: 0.3 10*3/uL (ref 0.0–0.7)
Eosinophils Relative: 3.4 % (ref 0.0–5.0)
HCT: 40.7 % (ref 36.0–46.0)
Hemoglobin: 13.9 g/dL (ref 12.0–15.0)
LYMPHS ABS: 2.5 10*3/uL (ref 0.7–4.0)
Lymphocytes Relative: 31.4 % (ref 12.0–46.0)
MCHC: 34.3 g/dL (ref 30.0–36.0)
MCV: 92.9 fl (ref 78.0–100.0)
MONO ABS: 0.7 10*3/uL (ref 0.1–1.0)
MONOS PCT: 9.5 % (ref 3.0–12.0)
NEUTROS ABS: 4.3 10*3/uL (ref 1.4–7.7)
NEUTROS PCT: 55.3 % (ref 43.0–77.0)
PLATELETS: 348 10*3/uL (ref 150.0–400.0)
RBC: 4.38 Mil/uL (ref 3.87–5.11)
RDW: 13.4 % (ref 11.5–15.5)
WBC: 7.8 10*3/uL (ref 4.0–10.5)

## 2015-05-25 LAB — SEDIMENTATION RATE: Sed Rate: 18 mm/hr (ref 0–22)

## 2015-05-25 NOTE — Progress Notes (Signed)
   Subjective:    Patient ID: April Vaughn, female    DOB: [birth year], 55 y.o.   MRN: [id number]  HPI  She states that she has had intermittent cervical lymphadenopathy over the last 18 months. This typically occurs posteriorly with additional lymph node enlargement intermittently in the right sub mandibular area.  Evaluation has included ultrasound as well as a dental exam.  She's had recurrence of the right submandibular lymph node tenderness and subjective enlargement. This has been associated with discomfort in her ears and intermittent dizziness. She also has pressure in her sinuses. She denies other extrinsic symptoms; but she feels the process is "allergic".  She has had some exertional dyspnea on a treadmill.  Review of systems is positive for intermittent loose to watery stools despite eating yogurt daily.   Review of Systems Frontal headache, facial PAIN , nasal purulence, dental pain, sore throat , otic  discharge denied. Extrinsic symptoms of itchy, watery eyes, sneezing, or angioedema are denied. There is no significant cough, sputum production, wheezing,or  paroxysmal nocturnal dyspnea. No fever , chills or sweats.No unexplained weight loss, abdominal pain, significant dyspepsia, dysphagia, melena, rectal bleeding, or persistently small caliber stools.      Objective:   Physical Exam   She appears fatigued. Ptosis greater on the right than the left. This is slight hyponasal speech pattern. I cannot appreciate any cervical lymphadenopathy.  General appearance:Thin but adequately nourished; no acute distress or increased work of breathing is present.    Lymphatic: No  lymphadenopathy about the head, neck, or axilla .  Eyes: No conjunctival inflammation or lid edema is present. There is no scleral icterus.  Ears:  External ear exam shows no significant lesions or deformities.  Otoscopic examination reveals clear canals, tympanic membranes are intact bilaterally without  bulging, retraction, inflammation or discharge.  Nose:  External nasal examination shows no deformity or inflammation. Nasal mucosa are pink and moist without lesions or exudates No septal dislocation or deviation.No obstruction to airflow.   Oral exam: Dental hygiene is good; lips and gums are healthy appearing.There is no oropharyngeal erythema or exudate .  Neck:  No deformities, thyromegaly, masses, or tenderness noted.   Supple with full range of motion without pain.   Heart:  Normal rate and regular rhythm. S1 and S2 normal without gallop, murmur, click, rub or other extra sounds.   Lungs:Chest clear to auscultation; no wheezes, rhonchi,rales ,or rubs present.  Extremities:  No cyanosis, edema, or clubbing  noted    Skin: Warm & dry w/o tenting or jaundice. No significant lesions or rash.     Assessment & Plan:  #1 intermittent cervical lymphadenopathy  #2 sinus pressure  #3 exertional dyspnea  Plan: See orders

## 2015-05-25 NOTE — Patient Instructions (Signed)
Plain Mucinex (NOT D) for thick secretions ;force NON dairy fluids .   Nasal cleansing in the shower as discussed with lather of mild shampoo.After 10 seconds wash off lather while  exhaling through nostrils. Make sure that all residual soap is removed to prevent irritation.  Flonase OR Nasacort AQ 1 spray in each nostril twice a day as needed. Use the "crossover" technique into opposite nostril spraying toward opposite ear @ 45 degree angle, not straight up into nostril.  Plain Allegra (NOT D )  160 daily , Loratidine 10 mg , OR Zyrtec 10 mg @ bedtime  as needed for itchy eyes & sneezing.  Please take a probiotic , Florastor OR Align, every day if the bowels are loose. This will replace the normal bacteria which  are necessary for formation of normal stool and processing of food. 

## 2015-05-25 NOTE — Progress Notes (Signed)
Pre visit review using our clinic review tool, if applicable. No additional management support is needed unless otherwise documented below in the visit note. 

## 2015-06-28 ENCOUNTER — Other Ambulatory Visit: Payer: Self-pay

## 2015-06-28 DIAGNOSIS — Z1231 Encounter for screening mammogram for malignant neoplasm of breast: Secondary | ICD-10-CM

## 2015-07-13 ENCOUNTER — Ambulatory Visit
Admission: RE | Admit: 2015-07-13 | Discharge: 2015-07-13 | Disposition: A | Discharge status: Home / Self Care | Payer: Federal, State, Local not specified - PPO | Source: Ambulatory Visit

## 2015-07-13 DIAGNOSIS — Z1231 Encounter for screening mammogram for malignant neoplasm of breast: Secondary | ICD-10-CM

## 2015-09-08 ENCOUNTER — Encounter: Payer: Self-pay | Admitting: Nurse Practitioner

## 2015-09-08 ENCOUNTER — Ambulatory Visit (INDEPENDENT_AMBULATORY_CARE_PROVIDER_SITE_OTHER): Payer: Federal, State, Local not specified - PPO | Admitting: Nurse Practitioner

## 2015-09-08 VITALS — BP 112/60 | HR 76 | Resp 16 | Wt 139.0 lb

## 2015-09-08 DIAGNOSIS — Z01419 Encounter for gynecological examination (general) (routine) without abnormal findings: Secondary | ICD-10-CM

## 2015-09-08 DIAGNOSIS — A609 Anogenital herpesviral infection, unspecified: Secondary | ICD-10-CM | POA: Diagnosis not present

## 2015-09-08 DIAGNOSIS — Z780 Asymptomatic menopausal state: Secondary | ICD-10-CM

## 2015-09-08 DIAGNOSIS — Z0000 Encounter for general adult medical examination without abnormal findings: Secondary | ICD-10-CM

## 2015-09-08 MED ORDER — VALACYCLOVIR HCL 1 G PO TABS: 1000.0000 mg | ORAL_TABLET | ORAL | Status: DC | PRN

## 2015-09-08 MED ORDER — PROGESTERONE MICRONIZED 200 MG PO CAPS: 200.0000 mg | ORAL_CAPSULE | Freq: Every day | ORAL | Status: DC

## 2015-09-08 MED ORDER — ESTRADIOL 0.5 MG PO TABS: 0.5000 mg | ORAL_TABLET | Freq: Every day | ORAL | Status: DC

## 2015-09-08 NOTE — Progress Notes (Signed)
Patient ID: April ChattersLynda S Vaughn, female   DOB: 1960/10/15, 55 y.o.   MRN: [id number]  55 y.o. G0P0000 Single  Caucasian Fe here for annual exam.  Recently increase in vaso symptoms that are worse in the morning about 7 am.  She is concerned that either she is not getting enough HRT or taking too much.  Same female partner for 3 yrs.  Patient's last menstrual period was 04/12/2011.          Sexually active: Yes.   female partner The current method of family planning is post menopausal status.    Exercising: Yes.    weights, yoga, cardio Smoker:  no  Health Maintenance: Pap: 09/05/14, Negative with neg HR HPV (no history of abnormal pap) MMG: 07/13/15, Bi-Rads 1: negative Colonoscopy:  09/01/2015; diverticula as per patient TDaP:  10/19/11 Hep C: 01/23/15 HIV: 11/23/12 Labs: 01/2015 in EP[facility]   reports that she has never smoked. She has never used smokeless tobacco. She reports that she drinks about 2.0 oz of alcohol per week. She reports that she does not use illicit drugs.  Past Medical History  Diagnosis Date  . Chloasma   . Diverticulosis 2012  . 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 History of PCR DNA positive for HSV1   . Anxiety   . Depression     Past Surgical History  Procedure Laterality Date  . Colonoscopy  09/07/2010  . Vericose vein Right 06/2011    Current Outpatient Prescriptions  Medication Sig Dispense Refill  . estradiol (ESTRACE) 0.5 MG tablet Take 1 tablet (0.5 mg total) by mouth daily. 90 tablet 4  . Fexofenadine HCl (ALLEGRA ALLERGY PO) Take by mouth daily.    . Multiple Vitamin (MULTIVITAMIN) tablet Take 1 tablet by mouth daily.    . progesterone (PROMETRIUM) 200 MG capsule Take 1 capsule (200 mg total) by mouth daily. Pt takes only the 1st-10th of month 90 capsule 4  . selenium sulfide (SELSUN) 2.5 % shampoo as needed.    . valACYclovir (VALTREX) 1000 MG tablet Take 1 tablet (1,000 mg total) by mouth as needed. 90 tablet 3   No current facility-administered  medications for this visit.    Family History  Problem Relation Age of Onset  . Coronary artery disease Mother [id number]    CABG, died 7274 due to MI  . Aneurysm Mother     behind eye  . Hypertension Father   . Coronary artery disease Father     CABG  . Kidney failure Father   . Colon cancer Maternal Grandfather [id number]  . Coronary artery disease Sister [id number]    MI, 9 stents  . Hyperlipidemia Sister   . Crohn's disease Sister [id number]  . Polymyalgia rheumatica Sister     PMR    ROS:  Pertinent items are noted in HPI.  Otherwise, a comprehensive ROS was negative.  Exam:   BP 112/60 mmHg  Pulse 76  Resp 16  Wt 139 lb (63.05 kg)  LMP 04/12/2011   Ht Readings from Last 3 Encounters:  01/23/15 5' 5.5" (1.664 m)  09/05/14 5' 5.5" (1.664 m)  06/21/14 5' 5.5" (1.664 m)    General appearance: alert, cooperative and appears stated age Head: Normocephalic, without obvious abnormality, atraumatic Neck: no adenopathy, supple, symmetrical, trachea midline and thyroid normal to inspection and palpation Lungs: clear to auscultation bilaterally Breasts: normal appearance, no masses or tenderness Heart: regular rate and rhythm Abdomen: soft, non-tender; no masses,  no organomegaly Extremities: extremities normal,  atraumatic, no cyanosis or edema Skin: Skin color, texture, turgor normal. No rashes or lesions Lymph nodes: Cervical, supraclavicular, and axillary nodes normal. No abnormal inguinal nodes palpated Neurologic: Grossly normal   Pelvic: External genitalia:  no lesions              Urethra:  normal appearing urethra with no masses, tenderness or lesions              Bartholin's and Skene's: normal                 Vagina: normal appearing vagina with normal color and discharge, no lesions              Cervix: anteverted              Pap taken: No. Bimanual Exam:  Uterus:  normal size, contour, position, consistency, mobility, non-tender              Adnexa: no mass, fullness, tenderness                Rectovaginal: Confirms               Anus:  normal sphincter tone, no lesions  Chaperone present: no  A:  Well Woman with normal exam  Postmenopausal on HRT since 06/2011 History of anxiety and depression History of HSV  Recent increase in vaso symptoms  P:   Reviewed health and wellness pertinent to exam  Pap smear as above - not done with no history of abnormal pap  Mammogram is due 07/2016  Discussed that we could check her hormone levels but this may also be due to any increase in stress.  She admits to some manager changes and different protocols that could be giving her the stress.    Will follow with labs and recommendations  Refill on Valtrex  Refill on current dose of HRT  Counseled with risk of DVT, CVA, cancer, etc  Counseled on breast self exam, mammography screening, use and side effects of HRT, adequate intake of calcium and vitamin D, diet and exercise, Kegel's exercises return annually or prn  An After Visit Summary was printed and given to the patient.

## 2015-09-08 NOTE — Progress Notes (Signed)
Encounter reviewed by Dr. Jeydan Barner Amundson C. Silva.  

## 2015-09-08 NOTE — Patient Instructions (Signed)

## 2015-09-09 LAB — ESTRADIOL: ESTRADIOL: 19 pg/mL

## 2015-09-11 ENCOUNTER — Other Ambulatory Visit: Payer: Self-pay | Admitting: Nurse Practitioner

## 2015-09-11 MED ORDER — PROGESTERONE MICRONIZED 100 MG PO CAPS: 100.0000 mg | ORAL_CAPSULE | Freq: Every day | ORAL | Status: DC

## 2015-12-02 ENCOUNTER — Other Ambulatory Visit: Payer: Self-pay | Admitting: Nurse Practitioner

## 2015-12-04 ENCOUNTER — Encounter: Payer: Self-pay | Admitting: Nurse Practitioner

## 2015-12-04 NOTE — Telephone Encounter (Signed)
Medication refill request: Prometrium   Last AEX:  09-08-15  Next AEX: 09-17-16 Last MMG (if hormonal medication request): 07-14-15 WNL Refill authorized:  Please advise

## 2015-12-07 ENCOUNTER — Telehealth: Payer: Self-pay

## 2015-12-07 NOTE — Telephone Encounter (Signed)
Telephone encounter created to discuss mychart message with Leota Sauerseborah Leonard CNM as Ria CommentPatricia Grubb, FNP is out of the office today.

## 2015-12-07 NOTE — Telephone Encounter (Signed)
Spoke with patient. Patient states that she has been taking Prometrium 200 mg daily. Advised she will need to reduce her dosage to Prometrium 100 mg daily. Patient is agreeable and verbalizes understanding. Refill for Prometrium 100 mg daily #90 #3 was sent to the patient's pharmacy on 12/04/2015 by Ria CommentPatricia Grubb, FNP.  Cc: Ria CommentPatricia Grubb, FNP   Routing to provider for final review. Patient agreeable to disposition. Will close encounter.

## 2015-12-07 NOTE — Telephone Encounter (Signed)
Reviewed notes and 100mg  daily is correct.  Ok to refill

## 2015-12-07 NOTE — Telephone Encounter (Signed)
Routing to Leota Sauerseborah Leonard CNM  for review as April CommentPatricia Grubb, FNP is out of the office and advise. Per last Estradiol result on 09/08/2015 the patient was to switch to taking Prometrium 100 mg daily instead of 200 mg daily for ten days. Please advise. Okay to refill Progesterone 100 mg daily and ensure the patient is taking this once per day.   Non-Urgent Medical Question  Message [phone]  From Maureen ChattersLynda S Lantigua To April CommentPatricia Grubb, FNP Sent 12/04/2015 5:48 PM  During my last visit we up the Progesterone to 200mg . I am not sure I feel different from the 100mg .  I think we were going to evaluate how I feel. So if you recommend continuing than I'm okay with that, if not and I go back to 100 mg I'm okay with that too. I'm just currently out of my prescription.   Thanks   Responsible Party   Pool - [facility] Pool No one has taken responsibility for this message.  No actions have been taken on this message.

## 2015-12-18 DIAGNOSIS — M545 Low back pain: Secondary | ICD-10-CM | POA: Diagnosis not present

## 2015-12-18 DIAGNOSIS — M25561 Pain in right knee: Secondary | ICD-10-CM | POA: Diagnosis not present

## 2015-12-27 DIAGNOSIS — M25561 Pain in right knee: Secondary | ICD-10-CM | POA: Diagnosis not present

## 2016-01-01 DIAGNOSIS — M25561 Pain in right knee: Secondary | ICD-10-CM | POA: Diagnosis not present

## 2016-01-22 ENCOUNTER — Encounter: Payer: Self-pay | Admitting: Internal Medicine

## 2016-01-22 ENCOUNTER — Ambulatory Visit (INDEPENDENT_AMBULATORY_CARE_PROVIDER_SITE_OTHER): Payer: Federal, State, Local not specified - PPO | Admitting: Internal Medicine

## 2016-01-22 ENCOUNTER — Other Ambulatory Visit (INDEPENDENT_AMBULATORY_CARE_PROVIDER_SITE_OTHER): Payer: Federal, State, Local not specified - PPO

## 2016-01-22 VITALS — BP 108/78 | HR 61 | Temp 98.5°F | Resp 14 | Ht 66.0 in | Wt 134.0 lb

## 2016-01-22 DIAGNOSIS — Z0000 Encounter for general adult medical examination without abnormal findings: Secondary | ICD-10-CM | POA: Diagnosis not present

## 2016-01-22 DIAGNOSIS — Z23 Encounter for immunization: Secondary | ICD-10-CM

## 2016-01-22 LAB — LIPID PANEL
CHOL/HDL RATIO: 3
Cholesterol: 203 mg/dL — ABNORMAL HIGH (ref 0–200)
HDL: 80.9 mg/dL (ref >39.00)
LDL CALC: 104 mg/dL — AB (ref 0–99)
NONHDL: 122.45
Triglycerides: 90 mg/dL (ref 0.0–149.0)
VLDL: 18 mg/dL (ref 0.0–40.0)

## 2016-01-22 LAB — CBC
HCT: 40.8 % (ref 36.0–46.0)
HEMOGLOBIN: 13.7 g/dL (ref 12.0–15.0)
MCHC: 33.6 g/dL (ref 30.0–36.0)
MCV: 93.8 fl (ref 78.0–100.0)
Platelets: 311 10*3/uL (ref 150.0–400.0)
RBC: 4.35 Mil/uL (ref 3.87–5.11)
RDW: 14.5 % (ref 11.5–15.5)
WBC: 6.4 10*3/uL (ref 4.0–10.5)

## 2016-01-22 LAB — COMPREHENSIVE METABOLIC PANEL
ALT: 14 U/L (ref 0–35)
AST: 16 U/L (ref 0–37)
Albumin: 4.4 g/dL (ref 3.5–5.2)
Alkaline Phosphatase: 48 U/L (ref 39–117)
BILIRUBIN TOTAL: 0.5 mg/dL (ref 0.2–1.2)
BUN: 14 mg/dL (ref 6–23)
CO2: 28 meq/L (ref 19–32)
Calcium: 9.4 mg/dL (ref 8.4–10.5)
Chloride: 105 mEq/L (ref 96–112)
Creatinine, Ser: 0.83 mg/dL (ref 0.40–1.20)
GFR: 76.13 mL/min (ref >60.00)
GLUCOSE: 93 mg/dL (ref 70–99)
Potassium: 4.8 mEq/L (ref 3.5–5.1)
SODIUM: 139 meq/L (ref 135–145)
Total Protein: 7.4 g/dL (ref 6.0–8.3)

## 2016-01-22 NOTE — Patient Instructions (Signed)
We have given you the flu shot today and will check the labs.  Keep up the good work with the health. When trying to add back foods, add 1 thing at a time and limit the amount. If you get symptoms with the food then you know it does bother your GI system.   Health Maintenance, Female Adopting a healthy lifestyle and getting preventive care can go a long way to promote health and wellness. Talk with your health care provider about what schedule of regular examinations is right for you. This is a good chance for you to check in with your provider about disease prevention and staying healthy. In between checkups, there are plenty of things you can do on your own. Experts have done a lot of research about which lifestyle changes and preventive measures are most likely to keep you healthy. Ask your health care provider for more information. WEIGHT AND DIET  Eat a healthy diet  Be sure to include plenty of vegetables, fruits, low-fat dairy products, and lean protein.  Do not eat a lot of foods high in solid fats, added sugars, or salt.  Get regular exercise. This is one of the most important things you can do for your health.  Most adults should exercise for at least 150 minutes each week. The exercise should increase your heart rate and make you sweat (moderate-intensity exercise).  Most adults should also do strengthening exercises at least twice a week. This is in addition to the moderate-intensity exercise.  Maintain a healthy weight  Body mass index (BMI) is a measurement that can be used to identify possible weight problems. It estimates body fat based on height and weight. Your health care provider can help determine your BMI and help you achieve or maintain a healthy weight.  For females 56 years of age and older:   A BMI below 18.5 is considered underweight.  A BMI of 18.5 to 24.9 is normal.  A BMI of 25 to 29.9 is considered overweight.  A BMI of 30 and above is considered obese.   Watch levels of cholesterol and blood lipids  You should start having your blood tested for lipids and cholesterol at 54 years of age, then have this test every 5 years.  You may need to have your cholesterol levels checked more often if:  Your lipid or cholesterol levels are high.  You are older than 54 years of age.  You are at high risk for heart disease.  CANCER SCREENING   Lung Cancer  Lung cancer screening is recommended for adults 5-31 years old who are at high risk for lung cancer because of a history of smoking.  A yearly low-dose CT scan of the lungs is recommended for people who:  Currently smoke.  Have quit within the past 15 years.  Have at least a 30-pack-year history of smoking. A pack year is smoking an average of one pack of cigarettes a day for 1 year.  Yearly screening should continue until it has been 15 years since you quit.  Yearly screening should stop if you develop a health problem that would prevent you from having lung cancer treatment.  Breast Cancer  Practice breast self-awareness. This means understanding how your breasts normally appear and feel.  It also means doing regular breast self-exams. Let your health care provider know about any changes, no matter how small.  If you are in your 20s or 30s, you should have a clinical breast exam (CBE) by a  health care provider every 1-3 years as part of a regular health exam.  If you are 40 or older, have a CBE every year. Also consider having a breast X-ray (mammogram) every year.  If you have a family history of breast cancer, talk to your health care provider about genetic screening.  If you are at high risk for breast cancer, talk to your health care provider about having an MRI and a mammogram every year.  Breast cancer gene (BRCA) assessment is recommended for women who have family members with BRCA-related cancers. BRCA-related cancers  include:  Breast.  Ovarian.  Tubal.  Peritoneal cancers.  Results of the assessment will determine the need for genetic counseling and BRCA1 and BRCA2 testing. Cervical Cancer Your health care provider may recommend that you be screened regularly for cancer of the pelvic organs (ovaries, uterus, and vagina). This screening involves a pelvic examination, including checking for microscopic changes to the surface of your cervix (Pap test). You may be encouraged to have this screening done every 3 years, beginning at age 21.  For women ages 30-65, health care providers may recommend pelvic exams and Pap testing every 3 years, or they may recommend the Pap and pelvic exam, combined with testing for human papilloma virus (HPV), every 5 years. Some types of HPV increase your risk of cervical cancer. Testing for HPV may also be done on women of any age with unclear Pap test results.  Other health care providers may not recommend any screening for nonpregnant women who are considered low risk for pelvic cancer and who do not have symptoms. Ask your health care provider if a screening pelvic exam is right for you.  If you have had past treatment for cervical cancer or a condition that could lead to cancer, you need Pap tests and screening for cancer for at least 20 years after your treatment. If Pap tests have been discontinued, your risk factors (such as having a new sexual partner) need to be reassessed to determine if screening should resume. Some women have medical problems that increase the chance of getting cervical cancer. In these cases, your health care provider may recommend more frequent screening and Pap tests. Colorectal Cancer  This type of cancer can be detected and often prevented.  Routine colorectal cancer screening usually begins at 54 years of age and continues through 54 years of age.  Your health care provider may recommend screening at an earlier age if you have risk factors for  colon cancer.  Your health care provider may also recommend using home test kits to check for hidden blood in the stool.  A small camera at the end of a tube can be used to examine your colon directly (sigmoidoscopy or colonoscopy). This is done to check for the earliest forms of colorectal cancer.  Routine screening usually begins at age 50.  Direct examination of the colon should be repeated every 5-10 years through 54 years of age. However, you may need to be screened more often if early forms of precancerous polyps or small growths are found. Skin Cancer  Check your skin from head to toe regularly.  Tell your health care provider about any new moles or changes in moles, especially if there is a change in a mole's shape or color.  Also tell your health care provider if you have a mole that is larger than the size of a pencil eraser.  Always use sunscreen. Apply sunscreen liberally and repeatedly throughout the day.    Protect yourself by wearing long sleeves, pants, a wide-brimmed hat, and sunglasses whenever you are outside. HEART DISEASE, DIABETES, AND HIGH BLOOD PRESSURE   High blood pressure causes heart disease and increases the risk of stroke. High blood pressure is more likely to develop in:  People who have blood pressure in the high end of the normal range (130-139/85-89 mm Hg).  People who are overweight or obese.  People who are African American.  If you are 18-39 years of age, have your blood pressure checked every 3-5 years. If you are 40 years of age or older, have your blood pressure checked every year. You should have your blood pressure measured twice--once when you are at a hospital or clinic, and once when you are not at a hospital or clinic. Record the average of the two measurements. To check your blood pressure when you are not at a hospital or clinic, you can use:  An automated blood pressure machine at a pharmacy.  A home blood pressure monitor.  If you  are between 55 years and 79 years old, ask your health care provider if you should take aspirin to prevent strokes.  Have regular diabetes screenings. This involves taking a blood sample to check your fasting blood sugar level.  If you are at a normal weight and have a low risk for diabetes, have this test once every three years after 54 years of age.  If you are overweight and have a high risk for diabetes, consider being tested at a younger age or more often. PREVENTING INFECTION  Hepatitis B  If you have a higher risk for hepatitis B, you should be screened for this virus. You are considered at high risk for hepatitis B if:  You were born in a country where hepatitis B is common. Ask your health care provider which countries are considered high risk.  Your parents were born in a high-risk country, and you have not been immunized against hepatitis B (hepatitis B vaccine).  You have HIV or AIDS.  You use needles to inject street drugs.  You live with someone who has hepatitis B.  You have had sex with someone who has hepatitis B.  You get hemodialysis treatment.  You take certain medicines for conditions, including cancer, organ transplantation, and autoimmune conditions. Hepatitis C  Blood testing is recommended for:  Everyone born from 1945 through 1965.  Anyone with known risk factors for hepatitis C. Sexually transmitted infections (STIs)  You should be screened for sexually transmitted infections (STIs) including gonorrhea and chlamydia if:  You are sexually active and are younger than 54 years of age.  You are older than 54 years of age and your health care provider tells you that you are at risk for this type of infection.  Your sexual activity has changed since you were last screened and you are at an increased risk for chlamydia or gonorrhea. Ask your health care provider if you are at risk.  If you do not have HIV, but are at risk, it may be recommended that you  take a prescription medicine daily to prevent HIV infection. This is called pre-exposure prophylaxis (PrEP). You are considered at risk if:  You are sexually active and do not regularly use condoms or know the HIV status of your partner(s).  You take drugs by injection.  You are sexually active with a partner who has HIV. Talk with your health care provider about whether you are at high risk of being infected   with HIV. If you choose to begin PrEP, you should first be tested for HIV. You should then be tested every 3 months for as long as you are taking PrEP.  PREGNANCY   If you are premenopausal and you may become pregnant, ask your health care provider about preconception counseling.  If you may become pregnant, take 400 to 800 micrograms (mcg) of folic acid every day.  If you want to prevent pregnancy, talk to your health care provider about birth control (contraception). OSTEOPOROSIS AND MENOPAUSE   Osteoporosis is a disease in which the bones lose minerals and strength with aging. This can result in serious bone fractures. Your risk for osteoporosis can be identified using a bone density scan.  If you are 45 years of age or older, or if you are at risk for osteoporosis and fractures, ask your health care provider if you should be screened.  Ask your health care provider whether you should take a calcium or vitamin D supplement to lower your risk for osteoporosis.  Menopause may have certain physical symptoms and risks.  Hormone replacement therapy may reduce some of these symptoms and risks. Talk to your health care provider about whether hormone replacement therapy is right for you.  HOME CARE INSTRUCTIONS   Schedule regular health, dental, and eye exams.  Stay current with your immunizations.   Do not use any tobacco products including cigarettes, chewing tobacco, or electronic cigarettes.  If you are pregnant, do not drink alcohol.  If you are breastfeeding, limit how  much and how often you drink alcohol.  Limit alcohol intake to no more than 1 drink per day for nonpregnant women. One drink equals 12 ounces of beer, 5 ounces of wine, or 1 ounces of hard liquor.  Do not use street drugs.  Do not share needles.  Ask your health care provider for help if you need support or information about quitting drugs.  Tell your health care provider if you often feel depressed.  Tell your health care provider if you have ever been abused or do not feel safe at home.   This information is not intended to replace advice given to you by your health care provider. Make sure you discuss any questions you have with your health care provider.   Document Released: 09/10/2010 Document Revised: 03/18/2014 Document Reviewed: 01/27/2013 Elsevier Interactive Patient Education [employer].

## 2016-01-22 NOTE — Progress Notes (Signed)
Pre visit review using our clinic review tool, if applicable. No additional management support is needed unless otherwise documented below in the visit note. 

## 2016-01-22 NOTE — Assessment & Plan Note (Signed)
Colonoscopy done earlier this year, due again in 5 years. Checking labs today. Flu shot given at visit and tetanus up to date. Counseled on sun safety and mole surveillance. Counseled about dangers of distracted driving. Given screening recommendations.

## 2016-01-22 NOTE — Progress Notes (Signed)
   Subjective:    Patient ID: April Vaughn, female    DOB: [birth year], 55 y.o.   MRN: [id number]  HPI The patient is a 55 YO female coming in for wellness and to transition providers. She denies new complaints but wants to be tested for celiac. She gave up wheat and dairy recently and is doing much better with her GI system.   [facility] [place], social history reviewed and updated.   Review of Systems  Constitutional: Negative for activity change, appetite change, fatigue, fever and unexpected weight change.  HENT: Negative.   Eyes: Negative.   Respiratory: Negative.   Cardiovascular: Negative.   Gastrointestinal: Positive for diarrhea. Negative for abdominal distention, abdominal pain, blood in stool, constipation, nausea and rectal pain.  Musculoskeletal: Negative.   Skin: Negative.   Neurological: Negative.   Psychiatric/Behavioral: Negative.       Objective:   Physical Exam  Constitutional: She is oriented to person, place, and time. She appears well-developed and well-nourished.  HENT:  Head: Normocephalic and atraumatic.  Eyes: EOM are normal.  Neck: Normal range of motion.  Cardiovascular: Normal rate and regular rhythm.   Pulmonary/Chest: Effort normal and breath sounds normal. No respiratory distress. She has no wheezes. She has no rales.  Abdominal: Soft. Bowel sounds are normal. She exhibits no distension. There is no tenderness. There is no rebound.  Musculoskeletal: She exhibits no edema.  Neurological: She is alert and oriented to person, place, and time. Coordination normal.  Skin: Skin is warm and dry.  Psychiatric: She has a normal mood and affect.   Vitals:   01/22/16 0806  BP: 108/78  Pulse: 61  Resp: 14  Temp: 98.5 F (36.9 C)  TempSrc: Oral  SpO2: 98%  Weight: 134 lb (60.8 kg)  Height: 5\' 6"  (1.676 m)      Assessment & Plan:  Flu shot given at visit.

## 2016-01-25 LAB — GLIADIN ANTIBODIES, SERUM
GLIADIN IGA: 5 U (ref <20)
Gliadin IgG: 2 Units (ref <20)

## 2016-01-25 LAB — TISSUE TRANSGLUTAMINASE, IGA: TISSUE TRANSGLUTAMINASE AB, IGA: 1 U/mL (ref <4)

## 2016-01-26 LAB — RETICULIN ANTIBODIES, IGA W TITER: RETICULIN AB, IGA: NEGATIVE

## 2016-01-30 ENCOUNTER — Encounter: Payer: Self-pay | Admitting: Internal Medicine

## 2016-01-30 DIAGNOSIS — T781XXA Other adverse food reactions, not elsewhere classified, initial encounter: Secondary | ICD-10-CM

## 2016-01-30 DIAGNOSIS — R198 Other specified symptoms and signs involving the digestive system and abdomen: Principal | ICD-10-CM

## 2016-03-22 ENCOUNTER — Encounter: Payer: Self-pay | Admitting: Internal Medicine

## 2016-03-22 MED ORDER — ESCITALOPRAM OXALATE 10 MG PO TABS: 10.0000 mg | ORAL_TABLET | Freq: Every day | ORAL | 3 refills | Status: DC

## 2016-05-01 DIAGNOSIS — K080 Exfoliation of teeth due to systemic causes: Secondary | ICD-10-CM | POA: Diagnosis not present

## 2016-05-30 DIAGNOSIS — M25561 Pain in right knee: Secondary | ICD-10-CM | POA: Diagnosis not present

## 2016-05-30 DIAGNOSIS — M222X1 Patellofemoral disorders, right knee: Secondary | ICD-10-CM | POA: Diagnosis not present

## 2016-06-05 DIAGNOSIS — M25561 Pain in right knee: Secondary | ICD-10-CM | POA: Diagnosis not present

## 2016-06-05 DIAGNOSIS — M222X1 Patellofemoral disorders, right knee: Secondary | ICD-10-CM | POA: Diagnosis not present

## 2016-06-12 DIAGNOSIS — M222X1 Patellofemoral disorders, right knee: Secondary | ICD-10-CM | POA: Diagnosis not present

## 2016-06-12 DIAGNOSIS — M25561 Pain in right knee: Secondary | ICD-10-CM | POA: Diagnosis not present

## 2016-06-18 ENCOUNTER — Encounter: Payer: Self-pay | Admitting: Allergy and Immunology

## 2016-06-18 ENCOUNTER — Ambulatory Visit (INDEPENDENT_AMBULATORY_CARE_PROVIDER_SITE_OTHER): Payer: Federal, State, Local not specified - PPO | Admitting: Allergy and Immunology

## 2016-06-18 VITALS — BP 108/62 | HR 82 | Resp 14 | Ht 65.0 in | Wt 140.0 lb

## 2016-06-18 DIAGNOSIS — Z91018 Allergy to other foods: Secondary | ICD-10-CM | POA: Diagnosis not present

## 2016-06-18 DIAGNOSIS — H6983 Other specified disorders of Eustachian tube, bilateral: Secondary | ICD-10-CM | POA: Diagnosis not present

## 2016-06-18 DIAGNOSIS — J3089 Other allergic rhinitis: Secondary | ICD-10-CM

## 2016-06-18 MED ORDER — AZELASTINE-FLUTICASONE 137-50 MCG/ACT NA SUSP: 1.0000 | Freq: Two times a day (BID) | NASAL | 5 refills | Status: DC | PRN

## 2016-06-18 NOTE — Progress Notes (Signed)
New Patient Note  RE: ADALEY KIENE MRN: [id number] DOB: 15-Dec-1960 Date of Office Visit: 06/18/2016  Referring provider: Myrlene Broker, * Primary care provider: Myrlene Broker, MD  Chief Complaint: Nasal Congestion; Sinus Problem; and Other (GI symptoms)   History of present illness: April Vaughn is a 56 y.o. female seen today in consultation requested by Hillard Danker, MD.  She reports that over the past 4 years she has experienced nasal congestion, thick postnasal drainage, sinus pressure, and ear pressure. No significant seasonal symptom variation has been noted nor have specific environmental triggers been identified.  She has attempted to control these symptoms with fexofenadine, pseudoephedrine, and/or Mucinex regular strength. Farzana also complains of GI symptoms which she has experienced over the past 18 months.  These symptoms consist of nausea, diarrhea, bloating and flatulence, and mild abdominal cramping.  The symptoms typically occur with meals, though no specific food triggers been identified.  She has noticed that legumes tend to make the symptoms worse, however she still experiences these GI symptoms while avoiding legumes.  For a period of time, she went off of all foods with the exception of meat and vegetables and her symptoms improved somewhat, however she started adding foods back into her diet and the symptoms returned.   Assessment and plan: Perennial allergic rhinitis with a nonallergic component  Aeroallergen avoidance measures have been discussed and provided in written form.  A prescription has been provided for Dymista (azelastine/fluticasone) nasal spray, 1 spray per nostril twice daily as needed. Proper nasal spray technique has been discussed and demonstrated.  Nasal saline lavage (NeilMed) has been recommended as needed and prior to medicated nasal sprays along with instructions for proper administration.  For thick post nasal  drainage, nasal congestion, and/or sinus pressure, add guaifenesin [phone] mg (Mucinex) plus/minus pseudoephedrine 60-120 mg  twice daily as needed with adequate hydration as discussed. Pseudoephedrine is only to be used for short-term relief of nasal/sinus congestion. Long-term use is discouraged due to potential side effects.  GI symptoms Gastrointestinal symptoms, uncertain etiology. Skin tests to select food allergens were negative today. The negative predictive value of food allergen skin testing is excellent (approximately 95%). While this does not appear to be an IgE mediated issue, skin testing does not rule out food intolerances or cell-mediated enteropathies which may lend to GI symptoms. These etiologies are suggested when elimination of the responsible food leads to symptom resolution and re-introduction of the food is followed by the return of symptoms.   The patient has been encouraged to keep a careful symptom/food journal and eliminate any food suspected of correlating with symptoms.   If GI symptoms persist or progress, gastroenterologist evaluation may be warranted.   Meds ordered this encounter  Medications  . Azelastine-Fluticasone 137-50 MCG/ACT SUSP    Sig: Place 1 spray into the nose 2 (two) times daily as needed.    Dispense:  23 g    Refill:  5    Diagnostics: Epicutaneous testing:  Negative despite a positive histamine control. Intradermal testing: Positive to dust mite antigen. Food allergen skin testing:  Negative despite a positive histamine control.    Physical examination: Blood pressure 108/62, pulse 82, resp. rate 14, height  (1.651 m), weight 140 lb (63.5 kg), last menstrual period 04/12/2011, SpO2 98 %.  General: Alert, interactive, in no acute distress. HEENT: TMs pearly gray, turbinates edematous without discharge, post-pharynx moderately erythematous. Neck: Supple without lymphadenopathy. Lungs: Clear to auscultation without wheezing, rhonchi  or  rales. CV: Normal S1, S2 without murmurs. Abdomen: Nondistended, nontender. Skin: Warm and dry, without lesions or rashes. Extremities:  No clubbing, cyanosis or edema. Neuro:   Grossly intact.  Review of systems:  Review of systems negative except as noted in HPI / PMHx or noted below: Review of Systems  Constitutional: Negative.   HENT: Negative.   Eyes: Negative.   Respiratory: Negative.   Cardiovascular: Negative.   Gastrointestinal: Negative.   Genitourinary: Negative.   Musculoskeletal: Negative.   Skin: Negative.   Neurological: Negative.   Endo/Heme/Allergies: Negative.   Psychiatric/Behavioral: Negative.     Past medical history:  Past Medical History:  Diagnosis Date  . Anxiety   . 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 Chloasma   . Depression   . Diverticulosis 2012  . History of PCR DNA positive for HSV1     Past surgical history:  Past Surgical History:  Procedure Laterality Date  . COLONOSCOPY  09/07/2010  . vericose vein Right 06/2011    Family history: Family History  Problem Relation Age of Onset  . Coronary artery disease Mother 48    CABG, died 73 due to MI  . Aneurysm Mother     behind eye  . Hypertension Father   . Coronary artery disease Father     CABG  . Kidney failure Father   . Coronary artery disease Sister 30    MI, 9 stents  . Hyperlipidemia Sister   . Crohn's disease Sister 22  . Polymyalgia rheumatica Sister     PMR  . Colon cancer Maternal Grandfather 75    Social history: Social History   Social History  . Marital status: Single    Spouse name: N/A  . Number of children: N/A  . Years of education: N/A   Occupational History  . Not on file.   Social History Main Topics  . Smoking status: Never Smoker  . Smokeless tobacco: Never Used  . Alcohol use 2.0 oz/week    4 Standard drinks or equivalent per week  . Drug use: No  . Sexual activity: Yes    Partners: Female    Birth control/ protection: None   Other  Topics Concern  . Not on file   Social History Narrative   Works as Database administrator.  Lives with significant other female partner.   Environmental History: The patient lives in a 55 year old house with hardwood floors throughout and central air/heat.  She is a nonsmoker without pets.  There is no known mold/water damage in the home.  Allergies as of 06/18/2016   No Known Allergies     Medication List       Accurate as of 06/18/16  5:19 PM. Always use your most recent med list.          ALLEGRA ALLERGY PO Take by mouth daily.   Azelastine-Fluticasone 137-50 MCG/ACT Susp Place 1 spray into the nose 2 (two) times daily as needed.   escitalopram 10 MG tablet Commonly known as:  LEXAPRO Take 1 tablet (10 mg total) by mouth daily.   estradiol 0.5 MG tablet Commonly known as:  ESTRACE Take 1 tablet (0.5 mg total) by mouth daily.   multivitamin tablet Take 1 tablet by mouth daily.   progesterone 100 MG capsule Commonly known as:  PROMETRIUM TAKE (1) CAPSULE DAILY.   selenium sulfide 2.5 % shampoo Commonly known as:  SELSUN as needed.   valACYclovir 1000 MG tablet Commonly known as:  VALTREX Take 1 tablet (  1,000 mg total) by mouth as needed.       Known medication allergies: No Known Allergies  I appreciate the opportunity to take part in Amy's care. Please do not hesitate to contact me with questions.  Sincerely,   R. Jorene Guest, MD

## 2016-06-18 NOTE — Patient Instructions (Addendum)
Perennial allergic rhinitis with a nonallergic component  Aeroallergen avoidance measures have been discussed and provided in written form.  A prescription has been provided for Dymista (azelastine/fluticasone) nasal spray, 1 spray per nostril twice daily as needed. Proper nasal spray technique has been discussed and demonstrated.  Nasal saline lavage (NeilMed) has been recommended as needed and prior to medicated nasal sprays along with instructions for proper administration.  For thick post nasal drainage, nasal congestion, and/or sinus pressure, add guaifenesin [phone] mg (Mucinex) plus/minus pseudoephedrine 60-120 mg  twice daily as needed with adequate hydration as discussed. Pseudoephedrine is only to be used for short-term relief of nasal/sinus congestion. Long-term use is discouraged due to potential side effects.  GI symptoms Gastrointestinal symptoms, uncertain etiology. Skin tests to select food allergens were negative today. The negative predictive value of food allergen skin testing is excellent (approximately 95%). While this does not appear to be an IgE mediated issue, skin testing does not rule out food intolerances or cell-mediated enteropathies which may lend to GI symptoms. These etiologies are suggested when elimination of the responsible food leads to symptom resolution and re-introduction of the food is followed by the return of symptoms.   The patient has been encouraged to keep a careful symptom/food journal and eliminate any food suspected of correlating with symptoms.   If GI symptoms persist or progress, gastroenterologist evaluation may be warranted.   Return in about 6 months (around 12/18/2016), or if symptoms worsen or fail to improve.  Control of House Dust Mite Allergen  House dust mites play a major role in allergic asthma and rhinitis.  They occur in environments with high humidity wherever human skin, the food for dust mites is found. High levels have been  detected in dust obtained from mattresses, pillows, carpets, upholstered furniture, bed covers, clothes and soft toys.  The principal allergen of the house dust mite is found in its feces.  A gram of dust may contain 1,000 mites and 250,000 fecal particles.  Mite antigen is easily measured in the air during house cleaning activities.    1. Encase mattresses, including the box spring, and pillow, in an air tight cover.  Seal the zipper end of the encased mattresses with wide adhesive tape. 2. Wash the bedding in water of 130 degrees Farenheit weekly.  Avoid cotton comforters/quilts and flannel bedding: the most ideal bed covering is the dacron comforter. 3. Remove all upholstered furniture from the bedroom. 4. Remove carpets, carpet padding, rugs, and non-washable window drapes from the bedroom.  Wash drapes weekly or use plastic window coverings. 5. Remove all non-washable stuffed toys from the bedroom.  Wash stuffed toys weekly. 6. Have the room cleaned frequently with a vacuum cleaner and a damp dust-mop.  The patient should not be in a room which is being cleaned and should wait 1 hour after cleaning before going into the room. 7. Close and seal all heating outlets in the bedroom.  Otherwise, the room will become filled with dust-laden air.  An electric heater can be used to heat the room. 8. Reduce indoor humidity to less than 50%.  Do not use a humidifier.

## 2016-06-18 NOTE — Assessment & Plan Note (Addendum)
   Aeroallergen avoidance measures have been discussed and provided in written form.  A prescription has been provided for Dymista (azelastine/fluticasone) nasal spray, 1 spray per nostril twice daily as needed. Proper nasal spray technique has been discussed and demonstrated.  Nasal saline lavage (NeilMed) has been recommended as needed and prior to medicated nasal sprays along with instructions for proper administration.  For thick post nasal drainage, nasal congestion, and/or sinus pressure, add guaifenesin [phone] mg (Mucinex) plus/minus pseudoephedrine 60-120 mg  twice daily as needed with adequate hydration as discussed. Pseudoephedrine is only to be used for short-term relief of nasal/sinus congestion. Long-term use is discouraged due to potential side effects.

## 2016-06-18 NOTE — Assessment & Plan Note (Signed)
Gastrointestinal symptoms, uncertain etiology. Skin tests to select food allergens were negative today. The negative predictive value of food allergen skin testing is excellent (approximately 95%). While this does not appear to be an IgE mediated issue, skin testing does not rule out food intolerances or cell-mediated enteropathies which may lend to GI symptoms. These etiologies are suggested when elimination of the responsible food leads to symptom resolution and re-introduction of the food is followed by the return of symptoms.   The patient has been encouraged to keep a careful symptom/food journal and eliminate any food suspected of correlating with symptoms.   If GI symptoms persist or progress, gastroenterologist evaluation may be warranted. 

## 2016-06-19 ENCOUNTER — Telehealth: Payer: Self-pay | Admitting: *Deleted

## 2016-06-19 DIAGNOSIS — M222X1 Patellofemoral disorders, right knee: Secondary | ICD-10-CM | POA: Diagnosis not present

## 2016-06-19 DIAGNOSIS — M25561 Pain in right knee: Secondary | ICD-10-CM | POA: Diagnosis not present

## 2016-06-19 NOTE — Telephone Encounter (Signed)
Yes, split. Thanks.

## 2016-06-19 NOTE — Telephone Encounter (Signed)
Received fax Dymista is not covered could we split. Please advise Dr Nunzio Cobbs

## 2016-06-20 MED ORDER — AZELASTINE HCL 0.1 % NA SOLN: 1.0000 | Freq: Two times a day (BID) | NASAL | 5 refills | Status: DC | PRN

## 2016-06-20 MED ORDER — FLUTICASONE PROPIONATE 50 MCG/ACT NA SUSP: 1.0000 | Freq: Two times a day (BID) | NASAL | 5 refills | Status: DC | PRN

## 2016-06-20 NOTE — Telephone Encounter (Signed)
Prescriptions have been sent. Patient is aware of med change and instructions.

## 2016-07-01 ENCOUNTER — Encounter: Payer: Self-pay | Admitting: Allergy and Immunology

## 2016-07-01 DIAGNOSIS — M222X1 Patellofemoral disorders, right knee: Secondary | ICD-10-CM | POA: Diagnosis not present

## 2016-07-01 DIAGNOSIS — M25561 Pain in right knee: Secondary | ICD-10-CM | POA: Diagnosis not present

## 2016-07-08 DIAGNOSIS — M25561 Pain in right knee: Secondary | ICD-10-CM | POA: Diagnosis not present

## 2016-07-08 DIAGNOSIS — M222X1 Patellofemoral disorders, right knee: Secondary | ICD-10-CM | POA: Diagnosis not present

## 2016-07-25 ENCOUNTER — Other Ambulatory Visit: Payer: Self-pay | Admitting: Internal Medicine

## 2016-08-01 ENCOUNTER — Encounter: Payer: Self-pay | Admitting: Nurse Practitioner

## 2016-08-01 ENCOUNTER — Telehealth: Payer: Self-pay | Admitting: *Deleted

## 2016-08-01 MED ORDER — PROGESTERONE MICRONIZED 100 MG PO CAPS: 100.0000 mg | ORAL_CAPSULE | Freq: Every day | ORAL | 0 refills | Status: DC

## 2016-08-01 NOTE — Telephone Encounter (Signed)
Spoke with patient, advised as seen below per Leota Sauerseborah Leonard, CNM. Patient agreeable to scheduling MMG, was not aware MMG due. Rx for progesterone to verified pharmacy. Patient verbalizes understanding and is agreeable.  Routing to provider for final review. Patient is agreeable to disposition. Will close encounter.

## 2016-08-01 NOTE — Telephone Encounter (Signed)
See telephone encounter dated 08/01/16 to review with provider.

## 2016-08-01 NOTE — Telephone Encounter (Signed)
Can refill one month, but needs to have mammogram completed, prior to another refill

## 2016-08-01 NOTE — Telephone Encounter (Signed)
April Vaughn, CNM -please advise?  Med refill request: progesterone 100 mg Last AEX: 09/08/15 April SauersNext AEX: 09/17/16 Last MMG (if hormonal med): 07/23/15 Bi-rad 1-negative; per review of EPIC, no MMG scheduled to date. Refill authorized: Please Advise?  Routing to covering provider for Ria CommentPatricia Grubb, NP, April Sauerseborah Vaughn, CNM.   Non-Urgent Medical Question  Message [id number]  From Maureen ChattersLynda S Vaughn To Ria CommentPatricia Grubb, FNP Sent 08/01/2016 10:46 AM  My Pharmacy [facility]( [place]) told me that my prescription for Progesterone 100 mg. has expired.  Is it possible to call in a new prescription, so that i can refill ? my next appointment is in July.    Cc: Ria CommentPatricia Grubb, NP

## 2016-08-06 ENCOUNTER — Other Ambulatory Visit: Payer: Self-pay | Admitting: Nurse Practitioner

## 2016-08-06 DIAGNOSIS — Z1231 Encounter for screening mammogram for malignant neoplasm of breast: Secondary | ICD-10-CM

## 2016-08-22 ENCOUNTER — Ambulatory Visit
Admission: RE | Admit: 2016-08-22 | Discharge: 2016-08-22 | Disposition: A | Discharge status: Home / Self Care | Payer: Federal, State, Local not specified - PPO | Source: Ambulatory Visit | Attending: Nurse Practitioner | Admitting: Nurse Practitioner

## 2016-08-22 DIAGNOSIS — Z1231 Encounter for screening mammogram for malignant neoplasm of breast: Secondary | ICD-10-CM

## 2016-09-10 ENCOUNTER — Ambulatory Visit (INDEPENDENT_AMBULATORY_CARE_PROVIDER_SITE_OTHER): Payer: Federal, State, Local not specified - PPO | Admitting: Certified Nurse Midwife

## 2016-09-10 ENCOUNTER — Encounter: Payer: Self-pay | Admitting: Certified Nurse Midwife

## 2016-09-10 ENCOUNTER — Other Ambulatory Visit (HOSPITAL_COMMUNITY)
Admission: RE | Admit: 2016-09-10 | Discharge: 2016-09-10 | Disposition: A | Discharge status: Home / Self Care | Payer: Federal, State, Local not specified - PPO | Source: Ambulatory Visit | Attending: Certified Nurse Midwife | Admitting: Certified Nurse Midwife

## 2016-09-10 VITALS — BP 110/80 | HR 68 | Resp 16 | Ht 65.75 in | Wt 140.0 lb

## 2016-09-10 DIAGNOSIS — Z124 Encounter for screening for malignant neoplasm of cervix: Secondary | ICD-10-CM

## 2016-09-10 DIAGNOSIS — N951 Menopausal and female climacteric states: Secondary | ICD-10-CM | POA: Diagnosis not present

## 2016-09-10 DIAGNOSIS — Z01419 Encounter for gynecological examination (general) (routine) without abnormal findings: Secondary | ICD-10-CM

## 2016-09-10 MED ORDER — ESTRADIOL 0.5 MG PO TABS: ORAL_TABLET | ORAL | 2 refills | Status: DC

## 2016-09-10 MED ORDER — PROGESTERONE MICRONIZED 100 MG PO CAPS: 100.0000 mg | ORAL_CAPSULE | Freq: Every day | ORAL | 12 refills | Status: DC

## 2016-09-10 NOTE — Patient Instructions (Signed)

## 2016-09-10 NOTE — Progress Notes (Signed)
56 y.o. G0P0000 Single  Caucasian Fe here for annual exam. Menopausal on HRT. Working well, desires continuance. 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 See PCP for aex, allergy and anxiety management. All stable per patient. Would like to decrease estrogen dose if possible, due to family history of CAD in sister,mother, father, all deceased. Doing well no hot flashes or night sweats in a long time. Exercising and feeling well. No other health issues today. Planning summer trip.  Patient's last menstrual period was 04/12/2011.          Sexually active: Yes.    The current method of family planning is none. Female partner Exercising: Yes.    cardio & weights Smoker:  no  Health Maintenance: Pap:  09-05-14 neg HPV HR neg History of Abnormal Pap: no MMG:  08-22-16 category b density birads 1:neg Self Breast exams: no Colonoscopy:  2017 diverticuli per patient, f/u 9065yrs BMD:   none TDaP:  2013 Shingles: no Pneumonia: no Hep C and HIV: Hep c neg 2016, HIV neg 2014 Labs: none   reports that she has never smoked. She has never used smokeless tobacco. She reports that she drinks about 1.8 oz of alcohol per week . She reports that she does not use drugs.  Past Medical History:  Diagnosis Date  . Anxiety   . 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 Chloasma   . Depression   . Diverticulosis 2012  . History of PCR DNA positive for HSV1     Past Surgical History:  Procedure Laterality Date  . COLONOSCOPY  09/07/2010  . vericose vein Right 06/2011    Current Outpatient Prescriptions  Medication Sig Dispense Refill  . escitalopram (LEXAPRO) 10 MG tablet TAKE 1 TABLET ONCE DAILY. 30 tablet 4  . estradiol (ESTRACE) 0.5 MG tablet Take 1 tablet (0.5 mg total) by mouth daily. 90 tablet 4  . Fexofenadine HCl (ALLEGRA ALLERGY PO) Take by mouth daily.    . Multiple Vitamin (MULTIVITAMIN) tablet Take 1 tablet by mouth daily.    . progesterone (PROMETRIUM) 100 MG capsule Take 1 capsule (100 mg total) by mouth daily.  30 capsule 0  . selenium sulfide (SELSUN) 2.5 % shampoo as needed.    . valACYclovir (VALTREX) 1000 MG tablet Take 1 tablet (1,000 mg total) by mouth as needed. 90 tablet 3   No current facility-administered medications for this visit.     Family History  Problem Relation Age of Onset  . Coronary artery disease Mother [id number]       CABG, died 7374 due to MI  . Aneurysm Mother        behind eye  . Hypertension Father   . Coronary artery disease Father        CABG  . Kidney failure Father   . Coronary artery disease Sister [id number]       MI, 9 stents  . Hyperlipidemia Sister   . Crohn's disease Sister 2442  . Polymyalgia rheumatica Sister        PMR  . Colon cancer Maternal Grandfather 62    ROS:  Pertinent items are noted in HPI.  Otherwise, a comprehensive ROS was negative.  Exam:   BP 110/80   Pulse 68   Resp 16   Ht 5' 5.75" (1.67 m)   Wt 140 lb (63.5 kg)   LMP 04/12/2011   BMI 22.77 kg/m  Height: 5' 5.75" (167 cm) Ht Readings from Last 3 Encounters:  09/10/16 5' 5.75" (1.67 m)  06/18/16 5\' 5"  (1.651 m)  01/22/16 5\' 6"  (1.676 m)    General appearance: alert, cooperative and appears stated age Head: Normocephalic, without obvious abnormality, atraumatic Neck: no adenopathy, supple, symmetrical, trachea midline and thyroid normal to inspection and palpation Lungs: clear to auscultation bilaterally Breasts: normal appearance, no masses or tenderness, No nipple retraction or dimpling, No nipple discharge or bleeding, No axillary or supraclavicular adenopathy Heart: regular rate and rhythm Abdomen: soft, non-tender; no masses,  no organomegaly Extremities: extremities normal, atraumatic, no cyanosis or edema Skin: Skin color, texture, turgor normal. No rashes or lesions Lymph nodes: Cervical, supraclavicular, and axillary nodes normal. No abnormal inguinal nodes palpated Neurologic: Grossly normal   Pelvic: External genitalia:  no lesions              Urethra:  normal  appearing urethra with no masses, tenderness or lesions              Bartholin's and Skene's: normal                 Vagina: normal appearing vagina with normal color and discharge, no lesions              Cervix: no cervical motion tenderness, no lesions and normal appearance              Pap taken: Yes.   Bimanual Exam:  Uterus:  normal size, contour, position, consistency, mobility, non-tender              Adnexa: normal adnexa and no mass, fullness, tenderness               Rectovaginal: Confirms               Anus:  normal sphincter tone, no lesions  Chaperone present: yes  A:  Well Woman with normal exam  Menopausal no HRT, desires decrease in dose due to family history of CAD  Allergies and anxiety with PCP management  P:   Reviewed health and wellness pertinent to exam.  Discussed risks/benefits/warning signs of HRT. Will decrease dose once finishes current dose and will advise if problems with changing. Discussed adjustment period. Questions addressed.  Rx Estrace 0.5mg  one half tablet daily with instructions  Rx Prometrium 100 mg daily   Continue follow up with PCP as indicated.  Pap smear: no   counseled on breast self exam, mammography screening, use and side effects of HRT, adequate intake of calcium and vitamin D, diet and exercise  return annually or prn  An After Visit Summary was printed and given to the patient.

## 2016-09-12 LAB — CYTOLOGY - PAP: DIAGNOSIS: NEGATIVE

## 2016-09-17 ENCOUNTER — Ambulatory Visit: Payer: Federal, State, Local not specified - PPO | Admitting: Nurse Practitioner

## 2016-10-11 DIAGNOSIS — B360 Pityriasis versicolor: Secondary | ICD-10-CM | POA: Diagnosis not present

## 2016-10-11 DIAGNOSIS — L918 Other hypertrophic disorders of the skin: Secondary | ICD-10-CM | POA: Diagnosis not present

## 2016-10-11 DIAGNOSIS — D2272 Melanocytic nevi of left lower limb, including hip: Secondary | ICD-10-CM | POA: Diagnosis not present

## 2016-10-11 DIAGNOSIS — D2271 Melanocytic nevi of right lower limb, including hip: Secondary | ICD-10-CM | POA: Diagnosis not present

## 2016-10-11 DIAGNOSIS — D225 Melanocytic nevi of trunk: Secondary | ICD-10-CM | POA: Diagnosis not present

## 2016-11-05 DIAGNOSIS — K080 Exfoliation of teeth due to systemic causes: Secondary | ICD-10-CM | POA: Diagnosis not present

## 2016-12-12 DIAGNOSIS — K080 Exfoliation of teeth due to systemic causes: Secondary | ICD-10-CM | POA: Diagnosis not present

## 2017-01-03 DIAGNOSIS — K080 Exfoliation of teeth due to systemic causes: Secondary | ICD-10-CM | POA: Diagnosis not present

## 2017-01-06 DIAGNOSIS — K080 Exfoliation of teeth due to systemic causes: Secondary | ICD-10-CM | POA: Diagnosis not present

## 2017-01-14 ENCOUNTER — Encounter: Payer: Self-pay | Admitting: Internal Medicine

## 2017-01-21 ENCOUNTER — Other Ambulatory Visit: Payer: Self-pay | Admitting: Certified Nurse Midwife

## 2017-01-21 DIAGNOSIS — A609 Anogenital herpesviral infection, unspecified: Secondary | ICD-10-CM

## 2017-01-21 NOTE — Telephone Encounter (Signed)
Medication refill request: valtrex Last AEX:  09/10/16 DL  Next AEX: 9/14/787/10/18  Last MMG (if hormonal medication request): 08/22/16 BIRADS 1 negative  Refill authorized: 09/08/15 #90, 3RF today, please advise

## 2017-01-23 ENCOUNTER — Other Ambulatory Visit (INDEPENDENT_AMBULATORY_CARE_PROVIDER_SITE_OTHER): Payer: Federal, State, Local not specified - PPO

## 2017-01-23 ENCOUNTER — Ambulatory Visit (INDEPENDENT_AMBULATORY_CARE_PROVIDER_SITE_OTHER): Payer: Federal, State, Local not specified - PPO | Admitting: Internal Medicine

## 2017-01-23 ENCOUNTER — Other Ambulatory Visit: Payer: Self-pay | Admitting: Internal Medicine

## 2017-01-23 ENCOUNTER — Encounter: Payer: Self-pay | Admitting: Internal Medicine

## 2017-01-23 VITALS — BP 108/80 | HR 56 | Temp 97.8°F | Ht 65.75 in | Wt 148.0 lb

## 2017-01-23 DIAGNOSIS — Z0000 Encounter for general adult medical examination without abnormal findings: Secondary | ICD-10-CM

## 2017-01-23 DIAGNOSIS — F324 Major depressive disorder, single episode, in partial remission: Secondary | ICD-10-CM | POA: Diagnosis not present

## 2017-01-23 DIAGNOSIS — Z23 Encounter for immunization: Secondary | ICD-10-CM | POA: Diagnosis not present

## 2017-01-23 LAB — COMPREHENSIVE METABOLIC PANEL
ALBUMIN: 3.9 g/dL (ref 3.5–5.2)
ALK PHOS: 55 U/L (ref 39–117)
ALT: 18 U/L (ref 0–35)
AST: 17 U/L (ref 0–37)
BILIRUBIN TOTAL: 0.3 mg/dL (ref 0.2–1.2)
BUN: 17 mg/dL (ref 6–23)
CO2: 28 mEq/L (ref 19–32)
Calcium: 9.1 mg/dL (ref 8.4–10.5)
Chloride: 104 mEq/L (ref 96–112)
Creatinine, Ser: 0.77 mg/dL (ref 0.40–1.20)
GFR: 82.71 mL/min (ref >60.00)
Glucose, Bld: 91 mg/dL (ref 70–99)
POTASSIUM: 4 meq/L (ref 3.5–5.1)
Sodium: 138 mEq/L (ref 135–145)
TOTAL PROTEIN: 6.9 g/dL (ref 6.0–8.3)

## 2017-01-23 LAB — LIPID PANEL
CHOLESTEROL: 213 mg/dL — AB (ref 0–200)
HDL: 65.9 mg/dL (ref >39.00)
LDL Cholesterol: 124 mg/dL — ABNORMAL HIGH (ref 0–99)
NonHDL: 147.59
Total CHOL/HDL Ratio: 3
Triglycerides: 120 mg/dL (ref 0.0–149.0)
VLDL: 24 mg/dL (ref 0.0–40.0)

## 2017-01-23 LAB — CBC
HCT: 39.4 % (ref 36.0–46.0)
Hemoglobin: 13.2 g/dL (ref 12.0–15.0)
MCHC: 33.4 g/dL (ref 30.0–36.0)
MCV: 97.4 fl (ref 78.0–100.0)
Platelets: 314 10*3/uL (ref 150.0–400.0)
RBC: 4.04 Mil/uL (ref 3.87–5.11)
RDW: 13.4 % (ref 11.5–15.5)
WBC: 7.1 10*3/uL (ref 4.0–10.5)

## 2017-01-23 MED ORDER — ESCITALOPRAM OXALATE 10 MG PO TABS: 10.0000 mg | ORAL_TABLET | Freq: Every day | ORAL | 3 refills | Status: DC

## 2017-01-23 NOTE — Progress Notes (Signed)
   Subjective:    Patient ID: April ChattersLynda S Grisby, female    DOB: 1960-11-18, 56 y.o.   MRN: [id number]  HPI The patient is a 56 YO female coming in for physical.  [facility], social history reviewed and updated.  Review of Systems  Constitutional: Negative.   HENT: Negative.   Eyes: Negative.   Respiratory: Negative for cough, chest tightness and shortness of breath.   Cardiovascular: Negative for chest pain, palpitations and leg swelling.  Gastrointestinal: Negative for abdominal distention, abdominal pain, constipation, diarrhea, nausea and vomiting.  Musculoskeletal: Negative.   Skin: Negative.   Neurological: Negative.   Psychiatric/Behavioral: Negative.       Objective:   Physical Exam  Constitutional: She is oriented to person, place, and time. She appears well-developed and well-nourished.  HENT:  Head: Normocephalic and atraumatic.  Eyes: EOM are normal.  Neck: Normal range of motion.  Cardiovascular: Normal rate and regular rhythm.  Pulmonary/Chest: Effort normal and breath sounds normal. No respiratory distress. She has no wheezes. She has no rales.  Abdominal: Soft. Bowel sounds are normal. She exhibits no distension. There is no tenderness. There is no rebound.  Musculoskeletal: She exhibits no edema.  Neurological: She is alert and oriented to person, place, and time. Coordination normal.  Skin: Skin is warm and dry.  Psychiatric: She has a normal mood and affect.   Vitals:   01/23/17 0803  BP: 108/80  Pulse: (!) 56  Temp: 97.8 F (36.6 C)  TempSrc: Oral  SpO2: 98%  Weight: 148 lb (67.1 kg)  Height: 5' 5.75" (1.67 m)      Assessment & Plan:  Flu shot given at visit

## 2017-01-23 NOTE — Assessment & Plan Note (Signed)
Mammogram and pap smear up to date. Colonoscopy up to date. Flu shot given at visit. Tetanus up to date. Placed on shingrix waiting list. Counseled about sun safety and mole surveillance. Given screening recommendations.

## 2017-01-23 NOTE — Assessment & Plan Note (Signed)
Lexapro 10 mg daily is controlling her symptoms.

## 2017-01-23 NOTE — Patient Instructions (Signed)
We will check the labs today and put you on the shingles vaccine waiting list.   Health Maintenance, Female Adopting a healthy lifestyle and getting preventive care can go a long way to promote health and wellness. Talk with your health care provider about what schedule of regular examinations is right for you. This is a good chance for you to check in with your provider about disease prevention and staying healthy. In between checkups, there are plenty of things you can do on your own. Experts have done a lot of research about which lifestyle changes and preventive measures are most likely to keep you healthy. Ask your health care provider for more information. Weight and diet Eat a healthy diet  Be sure to include plenty of vegetables, fruits, low-fat dairy products, and lean protein.  Do not eat a lot of foods high in solid fats, added sugars, or salt.  Get regular exercise. This is one of the most important things you can do for your health. ? Most adults should exercise for at least 150 minutes each week. The exercise should increase your heart rate and make you sweat (moderate-intensity exercise). ? Most adults should also do strengthening exercises at least twice a week. This is in addition to the moderate-intensity exercise.  Maintain a healthy weight  Body mass index (BMI) is a measurement that can be used to identify possible weight problems. It estimates body fat based on height and weight. Your health care provider can help determine your BMI and help you achieve or maintain a healthy weight.  For females 20 years of age and older: ? A BMI below 18.5 is considered underweight. ? A BMI of 18.5 to 24.9 is normal. ? A BMI of 25 to 29.9 is considered overweight. ? A BMI of 30 and above is considered obese.  Watch levels of cholesterol and blood lipids  You should start having your blood tested for lipids and cholesterol at 55 years of age, then have this test every 5 years.  You  may need to have your cholesterol levels checked more often if: ? Your lipid or cholesterol levels are high. ? You are older than 55 years of age. ? You are at high risk for heart disease.  Cancer screening Lung Cancer  Lung cancer screening is recommended for adults 55-80 years old who are at high risk for lung cancer because of a history of smoking.  A yearly low-dose CT scan of the lungs is recommended for people who: ? Currently smoke. ? Have quit within the past 15 years. ? Have at least a 30-pack-year history of smoking. A pack year is smoking an average of one pack of cigarettes a day for 1 year.  Yearly screening should continue until it has been 15 years since you quit.  Yearly screening should stop if you develop a health problem that would prevent you from having lung cancer treatment.  Breast Cancer  Practice breast self-awareness. This means understanding how your breasts normally appear and feel.  It also means doing regular breast self-exams. Let your health care provider know about any changes, no matter how small.  If you are in your 20s or 30s, you should have a clinical breast exam (CBE) by a health care provider every 1-3 years as part of a regular health exam.  If you are 40 or older, have a CBE every year. Also consider having a breast X-ray (mammogram) every year.  If you have a family history of breast   cancer, talk to your health care provider about genetic screening.  If you are at high risk for breast cancer, talk to your health care provider about having an MRI and a mammogram every year.  Breast cancer gene (BRCA) assessment is recommended for women who have family members with BRCA-related cancers. BRCA-related cancers include: ? Breast. ? Ovarian. ? Tubal. ? Peritoneal cancers.  Results of the assessment will determine the need for genetic counseling and BRCA1 and BRCA2 testing.  Cervical Cancer Your health care provider may recommend that you  be screened regularly for cancer of the pelvic organs (ovaries, uterus, and vagina). This screening involves a pelvic examination, including checking for microscopic changes to the surface of your cervix (Pap test). You may be encouraged to have this screening done every 3 years, beginning at age 48.  For women ages 45-65, health care providers may recommend pelvic exams and Pap testing every 3 years, or they may recommend the Pap and pelvic exam, combined with testing for human papilloma virus (HPV), every 5 years. Some types of HPV increase your risk of cervical cancer. Testing for HPV may also be done on women of any age with unclear Pap test results.  Other health care providers may not recommend any screening for nonpregnant women who are considered low risk for pelvic cancer and who do not have symptoms. Ask your health care provider if a screening pelvic exam is right for you.  If you have had past treatment for cervical cancer or a condition that could lead to cancer, you need Pap tests and screening for cancer for at least 20 years after your treatment. If Pap tests have been discontinued, your risk factors (such as having a new sexual partner) need to be reassessed to determine if screening should resume. Some women have medical problems that increase the chance of getting cervical cancer. In these cases, your health care provider may recommend more frequent screening and Pap tests.  Colorectal Cancer  This type of cancer can be detected and often prevented.  Routine colorectal cancer screening usually begins at 55 years of age and continues through 55 years of age.  Your health care provider may recommend screening at an earlier age if you have risk factors for colon cancer.  Your health care provider may also recommend using home test kits to check for hidden blood in the stool.  A small camera at the end of a tube can be used to examine your colon directly (sigmoidoscopy or  colonoscopy). This is done to check for the earliest forms of colorectal cancer.  Routine screening usually begins at age 38.  Direct examination of the colon should be repeated every 5-10 years through 55 years of age. However, you may need to be screened more often if early forms of precancerous polyps or small growths are found.  Skin Cancer  Check your skin from head to toe regularly.  Tell your health care provider about any new moles or changes in moles, especially if there is a change in a mole's shape or color.  Also tell your health care provider if you have a mole that is larger than the size of a pencil eraser.  Always use sunscreen. Apply sunscreen liberally and repeatedly throughout the day.  Protect yourself by wearing long sleeves, pants, a wide-brimmed hat, and sunglasses whenever you are outside.  Heart disease, diabetes, and high blood pressure  High blood pressure causes heart disease and increases the risk of stroke. High blood pressure  is more likely to develop in: ? People who have blood pressure in the high end of the normal range (130-139/85-89 mm Hg). ? People who are overweight or obese. ? People who are African American.  If you are 71-36 years of age, have your blood pressure checked every 3-5 years. If you are 64 years of age or older, have your blood pressure checked every year. You should have your blood pressure measured twice-once when you are at a hospital or clinic, and once when you are not at a hospital or clinic. Record the average of the two measurements. To check your blood pressure when you are not at a hospital or clinic, you can use: ? An automated blood pressure machine at a pharmacy. ? A home blood pressure monitor.  If you are between 63 years and 57 years old, ask your health care provider if you should take aspirin to prevent strokes.  Have regular diabetes screenings. This involves taking a blood sample to check your fasting blood sugar  level. ? If you are at a normal weight and have a low risk for diabetes, have this test once every three years after 55 years of age. ? If you are overweight and have a high risk for diabetes, consider being tested at a younger age or more often. Preventing infection Hepatitis B  If you have a higher risk for hepatitis B, you should be screened for this virus. You are considered at high risk for hepatitis B if: ? You were born in a country where hepatitis B is common. Ask your health care provider which countries are considered high risk. ? Your parents were born in a high-risk country, and you have not been immunized against hepatitis B (hepatitis B vaccine). ? You have HIV or AIDS. ? You use needles to inject street drugs. ? You live with someone who has hepatitis B. ? You have had sex with someone who has hepatitis B. ? You get hemodialysis treatment. ? You take certain medicines for conditions, including cancer, organ transplantation, and autoimmune conditions.  Hepatitis C  Blood testing is recommended for: ? Everyone born from [birth year] through 1964. ? Anyone with known risk factors for hepatitis C.  Sexually transmitted infections (STIs)  You should be screened for sexually transmitted infections (STIs) including gonorrhea and chlamydia if: ? You are sexually active and are younger than 55 years of age. ? You are older than 55 years of age and your health care provider tells you that you are at risk for this type of infection. ? Your sexual activity has changed since you were last screened and you are at an increased risk for chlamydia or gonorrhea. Ask your health care provider if you are at risk.  If you do not have HIV, but are at risk, it may be recommended that you take a prescription medicine daily to prevent HIV infection. This is called pre-exposure prophylaxis (PrEP). You are considered at risk if: ? You are sexually active and do not regularly use condoms or know the HIV  status of your partner(s). ? You take drugs by injection. ? You are sexually active with a partner who has HIV.  Talk with your health care provider about whether you are at high risk of being infected with HIV. If you choose to begin PrEP, you should first be tested for HIV. You should then be tested every 3 months for as long as you are taking PrEP. Pregnancy  If you are premenopausal and  you may become pregnant, ask your health care provider about preconception counseling.  If you may become pregnant, take 400 to 800 micrograms (mcg) of folic acid every day.  If you want to prevent pregnancy, talk to your health care provider about birth control (contraception). Osteoporosis and menopause  Osteoporosis is a disease in which the bones lose minerals and strength with aging. This can result in serious bone fractures. Your risk for osteoporosis can be identified using a bone density scan.  If you are 14 years of age or older, or if you are at risk for osteoporosis and fractures, ask your health care provider if you should be screened.  Ask your health care provider whether you should take a calcium or vitamin D supplement to lower your risk for osteoporosis.  Menopause may have certain physical symptoms and risks.  Hormone replacement therapy may reduce some of these symptoms and risks. Talk to your health care provider about whether hormone replacement therapy is right for you. Follow these instructions at home:  Schedule regular health, dental, and eye exams.  Stay current with your immunizations.  Do not use any tobacco products including cigarettes, chewing tobacco, or electronic cigarettes.  If you are pregnant, do not drink alcohol.  If you are breastfeeding, limit how much and how often you drink alcohol.  Limit alcohol intake to no more than 1 drink per day for nonpregnant women. One drink equals 12 ounces of beer, 5 ounces of wine, or 1 ounces of hard liquor.  Do not  use street drugs.  Do not share needles.  Ask your health care provider for help if you need support or information about quitting drugs.  Tell your health care provider if you often feel depressed.  Tell your health care provider if you have ever been abused or do not feel safe at home. This information is not intended to replace advice given to you by your health care provider. Make sure you discuss any questions you have with your health care provider. Document Released: 09/10/2010 Document Revised: 08/03/2015 Document Reviewed: 11/29/2014 Elsevier Interactive Patient Education  [employer].

## 2017-01-23 NOTE — Addendum Note (Signed)
Addended by: Berton LanGULCH, Kolt Mcwhirter R on: 01/23/2017 08:45 AM   Modules accepted: Orders

## 2017-02-27 ENCOUNTER — Telehealth: Payer: Self-pay

## 2017-02-27 NOTE — Telephone Encounter (Signed)
Left message asking patient to call back to schedule first shingrix vaccine----let tamara,RN know if appt is made so that vaccines can be labeled

## 2017-03-07 ENCOUNTER — Other Ambulatory Visit: Payer: Self-pay | Admitting: Certified Nurse Midwife

## 2017-03-07 DIAGNOSIS — N951 Menopausal and female climacteric states: Secondary | ICD-10-CM

## 2017-03-07 NOTE — Telephone Encounter (Signed)
Appt is schueduled for 03/12/2017 for Shingrix

## 2017-03-07 NOTE — Telephone Encounter (Signed)
Both vaccines labeled and placed in [facility] 

## 2017-03-11 HISTORY — PX: OTHER SURGICAL HISTORY: SHX169

## 2017-03-12 ENCOUNTER — Ambulatory Visit: Payer: Federal, State, Local not specified - PPO

## 2017-03-18 ENCOUNTER — Other Ambulatory Visit: Payer: Self-pay | Admitting: Certified Nurse Midwife

## 2017-03-18 DIAGNOSIS — A609 Anogenital herpesviral infection, unspecified: Secondary | ICD-10-CM

## 2017-03-19 NOTE — Telephone Encounter (Signed)
Medication refill request: Valtrex   Last AEX:  09-10-16  Next AEX: 09-17-17  Last MMG (if hormonal medication request): 08-22-16 WNL  Refill authorized: please advise

## 2017-03-31 ENCOUNTER — Ambulatory Visit (INDEPENDENT_AMBULATORY_CARE_PROVIDER_SITE_OTHER): Payer: Federal, State, Local not specified - PPO

## 2017-03-31 DIAGNOSIS — Z299 Encounter for prophylactic measures, unspecified: Secondary | ICD-10-CM | POA: Diagnosis not present

## 2017-05-29 DIAGNOSIS — K080 Exfoliation of teeth due to systemic causes: Secondary | ICD-10-CM | POA: Diagnosis not present

## 2017-06-04 ENCOUNTER — Encounter: Payer: Self-pay | Admitting: Internal Medicine

## 2017-06-04 ENCOUNTER — Ambulatory Visit: Payer: Federal, State, Local not specified - PPO | Admitting: Internal Medicine

## 2017-06-04 VITALS — BP 94/74 | HR 123 | Temp 99.9°F | Resp 16 | Wt 144.0 lb

## 2017-06-04 DIAGNOSIS — J0100 Acute maxillary sinusitis, unspecified: Secondary | ICD-10-CM | POA: Diagnosis not present

## 2017-06-04 DIAGNOSIS — J209 Acute bronchitis, unspecified: Secondary | ICD-10-CM

## 2017-06-04 DIAGNOSIS — J029 Acute pharyngitis, unspecified: Secondary | ICD-10-CM | POA: Insufficient documentation

## 2017-06-04 LAB — POCT RAPID STREP A (OFFICE): RAPID STREP A SCREEN: NEGATIVE

## 2017-06-04 MED ORDER — HYDROCODONE-HOMATROPINE 5-1.5 MG/5ML PO SYRP: 5.0000 mL | ORAL_SOLUTION | Freq: Three times a day (TID) | ORAL | 0 refills | Status: DC | PRN

## 2017-06-04 MED ORDER — AMOXICILLIN-POT CLAVULANATE 875-125 MG PO TABS: 1.0000 | ORAL_TABLET | Freq: Two times a day (BID) | ORAL | 0 refills | Status: DC

## 2017-06-04 NOTE — Assessment & Plan Note (Signed)
Likely bacterial  Start augmentin Prescription cough syrup otc cold medications Rest, fluid Call if no improvement  

## 2017-06-04 NOTE — Patient Instructions (Signed)
Complete the prescribed antibiotic.  Use the cough syrup at night.  Continue the over the counter cold medications.   Continue increased rest and fluids.   Your test for strep is negative.    Call if no improvement

## 2017-06-04 NOTE — Progress Notes (Signed)
Subjective:    Patient ID: April Vaughn, female    DOB: 01-03-1961, 57 y.o.   MRN: [id number]  HPI She is here for an acute visit for cold symptoms.  Her symptoms started 4 days ago  She is experiencing fever, chills, fatigue, nasal congestion, ear pain, sinus pain, sore throat that has improved over the last day, cough that is sometimes productive, shortness of breath, wheezing, body aches with the fevers and chills, headaches and lightheadedness/dizziness.  She has not had any nausea or diarrhea.  She has taken mucinex, advil cold and sinus.   Medications and allergies reviewed with patient and updated if appropriate.  Patient Active Problem List   Diagnosis Date Noted  . Routine general medical examination at a health care facility 01/22/2016  . Perennial allergic rhinitis with a nonallergic component 06/21/2014  . Depression     Current Outpatient Medications on File Prior to Visit  Medication Sig Dispense Refill  . escitalopram (LEXAPRO) 10 MG tablet Take 1 tablet (10 mg total) daily by mouth. 90 tablet 3  . estradiol (ESTRACE) 0.5 MG tablet One half tablet daily 90 tablet 2  . Fexofenadine HCl (ALLEGRA ALLERGY PO) Take by mouth daily.    . Multiple Vitamin (MULTIVITAMIN) tablet Take 1 tablet by mouth daily.    . progesterone (PROMETRIUM) 100 MG capsule Take 1 capsule (100 mg total) by mouth daily. 30 capsule 12  . selenium sulfide (SELSUN) 2.5 % shampoo as needed.    . valACYclovir (VALTREX) 1000 MG tablet TAKE (1) TABLET AS NEEDED. 30 tablet 6   No current facility-administered medications on file prior to visit.     Past Medical History:  Diagnosis Date  . Anxiety   . 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 Chloasma   . Depression   . Diverticulosis 2012  . History of PCR DNA positive for HSV1     Past Surgical History:  Procedure Laterality Date  . COLONOSCOPY  09/07/2010  . vericose vein Right 06/2011    Social History   Socioeconomic History  . Marital  status: Single    Spouse name: Not on file  . Number of children: Not on file  . Years of education: Not on file  . Highest education level: Not on file  Occupational History  . Not on file  Social Needs  . Financial resource strain: Not on file  . Food insecurity:    Worry: Not on file    Inability: Not on file  . Transportation needs:    Medical: Not on file    Non-medical: Not on file  Tobacco Use  . Smoking status: Never Smoker  . Smokeless tobacco: Never Used  Substance and Sexual Activity  . Alcohol use: Yes    Alcohol/week: 1.8 oz    Types: 3 Standard drinks or equivalent per week  . Drug use: No  . Sexual activity: Yes    Partners: Female    Birth control/protection: None  Lifestyle  . Physical activity:    Days per week: Not on file    Minutes per session: Not on file  . Stress: Not on file  Relationships  . Social connections:    Talks on phone: Not on file    Gets together: Not on file    Attends religious service: Not on file    Active member of club or organization: Not on file    Attends meetings of clubs or organizations: Not on file  Relationship status: Not on file  Other Topics Concern  . Not on file  Social History Narrative   Works as Database administrator.  Lives with significant other female partner.    Family History  Problem Relation Age of Onset  . Coronary artery disease Mother 29       CABG, died 24 due to MI  . Aneurysm Mother        behind eye  . Hypertension Father   . Coronary artery disease Father        CABG  . Kidney failure Father   . Coronary artery disease Sister 91       MI, 9 stents  . Hyperlipidemia Sister   . Crohn's disease Sister 58  . Polymyalgia rheumatica Sister        PMR  . Colon cancer Maternal Grandfather 62    Review of Systems  Constitutional: Positive for chills, fatigue and fever.  HENT: Positive for congestion, ear pain, sinus pain and sore throat (more yesterday).        Teeth pain  Respiratory:  Positive for cough (sometimes productive), shortness of breath and wheezing.   Gastrointestinal: Negative for diarrhea and nausea.  Musculoskeletal: Positive for myalgias (with fever/chills).  Neurological: Positive for dizziness, light-headedness and headaches.       Objective:   Vitals:   06/04/17 1115  BP: 94/74  Pulse: (!) 123  Resp: 16  Temp: 99.9 F (37.7 C)  SpO2: 98%   Filed Weights   06/04/17 1115  Weight: 144 lb (65.3 kg)   Body mass index is 23.42 kg/m.  Wt Readings from Last 3 Encounters:  06/04/17 144 lb (65.3 kg)  01/23/17 148 lb (67.1 kg)  09/10/16 140 lb (63.5 kg)     Physical Exam GENERAL APPEARANCE: Appears stated age, well appearing, NAD EYES: conjunctiva clear, no icterus HEENT: bilateral tympanic membranes and ear canals normal, oropharynx with moderate erythema, no thyromegaly, trachea midline, no cervical or supraclavicular lymphadenopathy LUNGS: unlabored breathing, good air entry bilaterally, slightly coarse breath sounds, but no discrete wheeze or crackles CARDIOVASCULAR: Normal S1,S2 without murmurs, no edema SKIN: warm, dry   Rapid strep test negative     Assessment & Plan:   See Problem List for Assessment and Plan of chronic medical problems.

## 2017-07-01 ENCOUNTER — Ambulatory Visit: Payer: Federal, State, Local not specified - PPO

## 2017-07-23 ENCOUNTER — Ambulatory Visit (INDEPENDENT_AMBULATORY_CARE_PROVIDER_SITE_OTHER): Payer: Federal, State, Local not specified - PPO | Admitting: *Deleted

## 2017-07-23 DIAGNOSIS — Z23 Encounter for immunization: Secondary | ICD-10-CM | POA: Diagnosis not present

## 2017-08-19 DIAGNOSIS — K080 Exfoliation of teeth due to systemic causes: Secondary | ICD-10-CM | POA: Diagnosis not present

## 2017-09-04 ENCOUNTER — Other Ambulatory Visit: Payer: Self-pay | Admitting: Certified Nurse Midwife

## 2017-09-04 DIAGNOSIS — Z1231 Encounter for screening mammogram for malignant neoplasm of breast: Secondary | ICD-10-CM

## 2017-09-05 ENCOUNTER — Ambulatory Visit
Admission: RE | Admit: 2017-09-05 | Discharge: 2017-09-05 | Disposition: A | Discharge status: Home / Self Care | Payer: Federal, State, Local not specified - PPO | Source: Ambulatory Visit | Attending: Certified Nurse Midwife | Admitting: Certified Nurse Midwife

## 2017-09-05 DIAGNOSIS — Z1231 Encounter for screening mammogram for malignant neoplasm of breast: Secondary | ICD-10-CM

## 2017-09-17 ENCOUNTER — Ambulatory Visit (INDEPENDENT_AMBULATORY_CARE_PROVIDER_SITE_OTHER): Payer: Federal, State, Local not specified - PPO | Admitting: Certified Nurse Midwife

## 2017-09-17 ENCOUNTER — Encounter: Payer: Self-pay | Admitting: Certified Nurse Midwife

## 2017-09-17 ENCOUNTER — Other Ambulatory Visit: Payer: Self-pay | Admitting: Certified Nurse Midwife

## 2017-09-17 ENCOUNTER — Other Ambulatory Visit: Payer: Self-pay

## 2017-09-17 VITALS — BP 120/78 | HR 72 | Resp 16 | Ht 65.75 in | Wt 147.0 lb

## 2017-09-17 DIAGNOSIS — B3731 Acute candidiasis of vulva and vagina: Secondary | ICD-10-CM

## 2017-09-17 DIAGNOSIS — R635 Abnormal weight gain: Secondary | ICD-10-CM

## 2017-09-17 DIAGNOSIS — B373 Candidiasis of vulva and vagina: Secondary | ICD-10-CM

## 2017-09-17 DIAGNOSIS — E559 Vitamin D deficiency, unspecified: Secondary | ICD-10-CM

## 2017-09-17 DIAGNOSIS — A609 Anogenital herpesviral infection, unspecified: Secondary | ICD-10-CM | POA: Diagnosis not present

## 2017-09-17 DIAGNOSIS — Z01411 Encounter for gynecological examination (general) (routine) with abnormal findings: Secondary | ICD-10-CM

## 2017-09-17 DIAGNOSIS — N951 Menopausal and female climacteric states: Secondary | ICD-10-CM

## 2017-09-17 MED ORDER — FLUCONAZOLE 150 MG PO TABS: ORAL_TABLET | ORAL | 0 refills | Status: DC

## 2017-09-17 MED ORDER — VALACYCLOVIR HCL 1 G PO TABS: 1000.0000 mg | ORAL_TABLET | Freq: Every day | ORAL | 12 refills | Status: DC

## 2017-09-17 NOTE — Addendum Note (Signed)
Addended by: Verner CholLEONARD, Hershell Brandl S on: 09/17/2017 12:23 PM   Modules accepted: Orders

## 2017-09-17 NOTE — Progress Notes (Signed)
57 y.o. G0P0000 Single  Caucasian Fe here for annual exam. Menopausal on HRT. Has decreased dosage to 0.025mg /Prometrium 100mg . This dosage is working well but has noted slight weight gain. Concerned about testosterone level well with decrease libido at times. No partner change or STD concerns or testing needed. Having dental implant done due to infection and currently taking antibiotics. Having vaginal irritation and itching and some increase in discharge in the past week. ? Yeast. Some vaginal dryness at times. Sees PCP as needed. Labs today if needed. No other health issues.  Patient's last menstrual period was 04/12/2011.          Sexually active: Yes.    The current method of family planning is none, female partner    Exercising: Yes.    cardio & weights Smoker:  no  Health Maintenance: Pap:  09-05-14 neg HPV HR neg, 09-10-16 neg History of Abnormal Pap: no MMG:  09-05-17 category c density birads 1:neg Self Breast exams: no Colonoscopy:  2017 diverticuli per patient f/u 48yrs BMD:   none TDaP:  2013 Shingles: 2019 Pneumonia: no Hep C and HIV: Hep c neg 2016, HIV neg 2014 Labs: if needed   reports that she has never smoked. She has never used smokeless tobacco. She reports that she drinks about 2.4 oz of alcohol per week. She reports that she does not use drugs.  Past Medical History:  Diagnosis Date  . Anxiety   . 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 Chloasma   . Depression   . Diverticulosis 2012  . History of PCR DNA positive for HSV1     Past Surgical History:  Procedure Laterality Date  . COLONOSCOPY  09/07/2010  . tooth implant  2019   bone graft  . vericose vein Right 06/2011    Current Outpatient Medications  Medication Sig Dispense Refill  . chlorhexidine (PERIDEX) 0.12 % solution     . escitalopram (LEXAPRO) 10 MG tablet Take 1 tablet (10 mg total) daily by mouth. 90 tablet 3  . estradiol (ESTRACE) 0.5 MG tablet One half tablet daily 90 tablet 2  . Fexofenadine HCl  (ALLEGRA ALLERGY PO) Take by mouth daily.    Marland Kitchen HYDROcodone-acetaminophen (NORCO/VICODIN) 5-325 MG tablet     . IBU 600 MG tablet     . penicillin v potassium (VEETID) 500 MG tablet     . progesterone (PROMETRIUM) 100 MG capsule Take 1 capsule (100 mg total) by mouth daily. 30 capsule 12  . selenium sulfide (SELSUN) 2.5 % shampoo as needed.    . valACYclovir (VALTREX) 1000 MG tablet TAKE (1) TABLET AS NEEDED. 30 tablet 6   No current facility-administered medications for this visit.     Family History  Problem Relation Age of Onset  . Coronary artery disease Mother 2       CABG, died 38 due to MI  . Aneurysm Mother        behind eye  . Hypertension Father   . Coronary artery disease Father        CABG  . Kidney failure Father   . Coronary artery disease Sister 49       MI, 9 stents  . Hyperlipidemia Sister   . Crohn's disease Sister 63  . Polymyalgia rheumatica Sister        PMR  . Colon cancer Maternal Grandfather 62    ROS:  Pertinent items are noted in HPI.  Otherwise, a comprehensive ROS was negative.  Exam:  BP 120/78   Pulse 72   Resp 16   Ht 5' 5.75" (1.67 m)   Wt 147 lb (66.7 kg)   LMP 04/12/2011   BMI 23.91 kg/m  Height: 5' 5.75" (167 cm) Ht Readings from Last 3 Encounters:  09/17/17 5' 5.75" (1.67 m)  01/23/17 5' 5.75" (1.67 m)  09/10/16 5' 5.75" (1.67 m)    General appearance: alert, cooperative and appears stated age Head: Normocephalic, without obvious abnormality, atraumatic Neck: no adenopathy, supple, symmetrical, trachea midline and thyroid normal to inspection and palpation Lungs: clear to auscultation bilaterally Breasts: normal appearance, no masses or tenderness, No nipple retraction or dimpling, No nipple discharge or bleeding, No axillary or supraclavicular adenopathy Heart: regular rate and rhythm Abdomen: soft, non-tender; no masses,  no organomegaly Extremities: extremities normal, atraumatic, no cyanosis or edema Skin: Skin color,  texture, turgor normal. No rashes or lesions Lymph nodes: Cervical, supraclavicular, and axillary nodes normal. No abnormal inguinal nodes palpated Neurologic: Grossly normal   Pelvic: External genitalia:  no lesions, but redness noted on vulva area with occasional scaling, no exudate, non tender              Urethra:  normal appearing urethra with no masses, tenderness or lesions              Bartholin's and Skene's: normal                 Vagina: normal appearing vagina with normal color and thick white discharge, no lesions,  Wet prep taken              Cervix: no cervical motion tenderness, no lesions and nulliparous appearance              Pap taken: No. Bimanual Exam:  Uterus:  normal size, contour, position, consistency, mobility, non-tender and anteverted              Adnexa: normal adnexa and no mass, fullness, tenderness               Rectovaginal: Confirms               Anus:  normal sphincter tone, no lesions Wet prep: KOH,Saline + yeast only, occasional RBC  Chaperone present: yes  A:  Well Woman with normal exam  Menopausal on HRT  R/0 vitamin d deficiency  Weight gain  Antibiotic induced yeast vaginitis  P:   Reviewed health and wellness pertinent to exam  Risks/benefits/warning signs and expectations reviewed, desires continuation at lower dose.  Rx Estrace 0.5 mg, 1/2 tablet daily see order with instructions  Rx Prometrium 100 mg daily see order with instructions  Lab: Vitamin D  Discussed weight changes and body changes with menopause. Discussed hormonal changes also. Encouraged to eat good choices and regular exercise.  Discussed yeast vaginitis finding most likely from antibiotic use. Work on yogurt in diet and or probiotic to keep GI tract healthy.  Rx Diflucan see order with instructions  Lab: Testosterone, TSH  Pap smear: no   counseled on breast self exam, mammography screening, feminine hygiene, use and side effects of HRT, adequate intake of calcium and  vitamin D, diet and exercise  return annually or prn  An After Visit Summary was printed and given to the patient.

## 2017-09-26 ENCOUNTER — Other Ambulatory Visit: Payer: Self-pay | Admitting: Certified Nurse Midwife

## 2017-09-26 ENCOUNTER — Encounter: Payer: Self-pay | Admitting: Certified Nurse Midwife

## 2017-09-26 ENCOUNTER — Telehealth: Payer: Self-pay

## 2017-09-26 DIAGNOSIS — N951 Menopausal and female climacteric states: Secondary | ICD-10-CM

## 2017-09-26 MED ORDER — ESTRADIOL 0.5 MG PO TABS: ORAL_TABLET | ORAL | 3 refills | Status: DC

## 2017-09-26 MED ORDER — PROGESTERONE MICRONIZED 100 MG PO CAPS: 100.0000 mg | ORAL_CAPSULE | Freq: Every day | ORAL | 12 refills | Status: DC

## 2017-09-26 NOTE — Telephone Encounter (Signed)
Pt notified of normal tsh, testosterone & vitamin d bloodwork. Pt aware labwork will put those results into our computer system.

## 2017-09-26 NOTE — Telephone Encounter (Signed)
This has already be filled

## 2017-09-26 NOTE — Telephone Encounter (Signed)
Patient has been called with lab results. See note

## 2017-09-26 NOTE — Telephone Encounter (Signed)
Medication refill request: prometrium Last AEX:09/17/17   Next AEX: 09/22/18 Last MMG (if hormonal medication request): 09/08/17 birads category 1 neg Refill authorized: please refill if appropriate.

## 2017-09-26 NOTE — Addendum Note (Signed)
Addended by: Verner CholLEONARD, Aniruddh Ciavarella S on: 09/26/2017 11:50 AM   Modules accepted: Orders

## 2017-09-27 LAB — TSH: TSH: 1.58 u[IU]/mL (ref 0.450–4.500)

## 2017-09-27 LAB — TESTOSTERONE, TOTAL, LC/MS/MS: Testosterone, total: 24.2 ng/dL

## 2017-09-27 LAB — VITAMIN D 25 HYDROXY (VIT D DEFICIENCY, FRACTURES): VIT D 25 HYDROXY: 33.3 ng/mL (ref 30.0–100.0)

## 2017-10-10 DIAGNOSIS — N951 Menopausal and female climacteric states: Secondary | ICD-10-CM | POA: Diagnosis not present

## 2017-10-14 DIAGNOSIS — N898 Other specified noninflammatory disorders of vagina: Secondary | ICD-10-CM | POA: Diagnosis not present

## 2017-10-14 DIAGNOSIS — R5383 Other fatigue: Secondary | ICD-10-CM | POA: Diagnosis not present

## 2017-10-14 DIAGNOSIS — R6882 Decreased libido: Secondary | ICD-10-CM | POA: Diagnosis not present

## 2017-10-14 DIAGNOSIS — N951 Menopausal and female climacteric states: Secondary | ICD-10-CM | POA: Diagnosis not present

## 2017-11-03 ENCOUNTER — Ambulatory Visit: Payer: Federal, State, Local not specified - PPO | Admitting: Internal Medicine

## 2017-11-03 ENCOUNTER — Encounter: Payer: Self-pay | Admitting: Internal Medicine

## 2017-11-03 DIAGNOSIS — J209 Acute bronchitis, unspecified: Secondary | ICD-10-CM

## 2017-11-03 MED ORDER — AZITHROMYCIN 250 MG PO TABS: ORAL_TABLET | ORAL | 0 refills | Status: DC

## 2017-11-03 NOTE — Assessment & Plan Note (Signed)
Rx for azithromycin and advised to take zyrtec over the counter.

## 2017-11-03 NOTE — Patient Instructions (Addendum)
We would recommend taking zyrtec or claritin to help with drainage.   We have sent in azithromcyin (z-pack). Take 2 pills today, then starting tomorrow take 1 pill daily until gone.

## 2017-11-03 NOTE — Progress Notes (Signed)
   Subjective:    Patient ID: April ChattersLynda S Fournet, female    DOB: 08/03/1960, 57 y.o.   MRN: [id number]  HPI The patient is a 57 YO female coming in for cough and SOB. [place] Started about 1 week ago. Overall is worsening. Has tried mucinex for it in the last day or so which has not helped. Is having cough (yellow to green sputum), SOB, nasal drainage, sinus pressure, ear popping and pain. Denies migraines or headaches. Denies chills but some fevers.   Review of Systems  Constitutional: Positive for activity change, appetite change and fever. Negative for chills, fatigue and unexpected weight change.  HENT: Positive for congestion, ear discharge, ear pain, postnasal drip, rhinorrhea, sinus pressure and sore throat. Negative for sinus pain, sneezing, tinnitus, trouble swallowing and voice change.   Eyes: Negative.   Respiratory: Positive for cough and shortness of breath. Negative for chest tightness and wheezing.   Cardiovascular: Negative.   Gastrointestinal: Negative.   Musculoskeletal: Positive for myalgias.  Neurological: Negative.       Objective:   Physical Exam  Constitutional: She is oriented to person, place, and time. She appears well-developed and well-nourished.  HENT:  Head: Normocephalic and atraumatic.  Oropharynx with redness and clear drainage, nose with swollen turbinates, TMs normal bilaterally  Eyes: EOM are normal.  Neck: Normal range of motion. No thyromegaly present.  Cardiovascular: Normal rate and regular rhythm.  Pulmonary/Chest: Effort normal and breath sounds normal. No respiratory distress. She has no wheezes. She has no rales.  Some rhonchi in the bases which partially clear with coughing  Abdominal: Soft.  Musculoskeletal: She exhibits tenderness.  Lymphadenopathy:    She has cervical adenopathy.  Neurological: She is alert and oriented to person, place, and time.  Skin: Skin is warm and dry.   Vitals:   11/03/17  1000  BP: 100/72  Pulse: 72  Temp: 98.2 F (36.8 C)  TempSrc: Oral  SpO2: 96%  Weight: 151 lb (68.5 kg)  Height: 5' 5.75" (1.67 m)      Assessment & Plan:

## 2017-11-12 DIAGNOSIS — R6882 Decreased libido: Secondary | ICD-10-CM | POA: Diagnosis not present

## 2017-11-12 DIAGNOSIS — N898 Other specified noninflammatory disorders of vagina: Secondary | ICD-10-CM | POA: Diagnosis not present

## 2017-11-12 DIAGNOSIS — N951 Menopausal and female climacteric states: Secondary | ICD-10-CM | POA: Diagnosis not present

## 2017-11-12 DIAGNOSIS — R5383 Other fatigue: Secondary | ICD-10-CM | POA: Diagnosis not present

## 2017-11-19 DIAGNOSIS — R6882 Decreased libido: Secondary | ICD-10-CM | POA: Diagnosis not present

## 2017-11-19 DIAGNOSIS — N898 Other specified noninflammatory disorders of vagina: Secondary | ICD-10-CM | POA: Diagnosis not present

## 2017-11-19 DIAGNOSIS — N951 Menopausal and female climacteric states: Secondary | ICD-10-CM | POA: Diagnosis not present

## 2017-11-20 ENCOUNTER — Ambulatory Visit: Payer: Self-pay | Admitting: Nurse Practitioner

## 2017-11-20 VITALS — BP 112/78 | HR 75 | Temp 98.7°F | Resp 20 | Wt 152.6 lb

## 2017-11-20 DIAGNOSIS — L299 Pruritus, unspecified: Secondary | ICD-10-CM

## 2017-11-20 MED ORDER — PREDNISONE 10 MG (21) PO TBPK: ORAL_TABLET | ORAL | 0 refills | Status: AC

## 2017-11-20 NOTE — Progress Notes (Signed)
Subjective:    Patient ID: April Vaughn, female    DOB: 02-20-1961, 57 y.o.   MRN: [id number]  The patient is a 57 year old female who presents today for complaints of itchiness and rash.  The patient states her symptoms started 4 days ago, after staying in a hotel, using sunscreen on the beach, and eating seafood.  The patient is wondering what could be the cause of the itching and rash.  The patient states she has taken Benadryl and use hydrocortisone cream for the rash.  Patient states that the rash waxes and wanes, and especially after her Benadryl wears off the itching starts.  The patient states that itching is on the soles of her feet, palms of both hands, in her groin area, and under her buttocks along her panty line.  The patient denies any use of new soaps, detergents,'s perfumes, lotions, or body washes.    Rash  This is a new problem. The current episode started in the past 7 days. The problem has been waxing and waning since onset. The rash is diffuse. The rash is characterized by itchiness. It is unknown if there was an exposure to a precipitant. Past treatments include antihistamine and topical steroids. The treatment provided moderate relief. There is no history of allergies, asthma or eczema.   I reviewed the patient's past medical history, current medications, and allergies.   Review of Systems  Constitutional: Negative.   HENT: Negative.   Eyes: Negative.   Respiratory: Negative.   Cardiovascular: Negative.   Gastrointestinal: Negative.   Skin: Positive for rash.       Itching and redness to bilateral hands, soles of feet, lower buttocks       Objective:   Physical Exam  Constitutional: She is oriented to person, place, and time. She appears well-developed and well-nourished. No distress.  HENT:  Head: Normocephalic and atraumatic.  Right Ear: External ear normal.  Left Ear: External ear normal.  Eyes: Pupils are equal, round, and reactive to light. EOM are  normal.  Neck: Normal range of motion. Neck supple. No thyromegaly present.  Cardiovascular: Normal rate, regular rhythm and normal heart sounds.  Pulmonary/Chest: Effort normal and breath sounds normal.  Abdominal: Soft. Bowel sounds are normal.  Neurological: She is alert and oriented to person, place, and time.  Skin: Skin is warm and dry.  No rashes visible at this time.  Patient states her hands are beginning to itch as her Benadryl is wearing off.  Examined the patient's hands, soles of feet, and along her panty line, with no visible rash.  Patient has 1 area of redness with small area of induration measuring approximately .25cm to the right inner thigh.  Area is red.      Assessment & Plan:  Pruritic Dermatitis and rash  Exam findings, diagnosis etiology and medication use and indications reviewed with patient. Follow- Up and discharge instructions provided. No emergent/urgent issues found on exam.  Patient education was provided. Patient verbalized understanding of information provided and agrees with plan of care (POC), all questions answered. The patient is advised to call or return to clinic if he does not see an improvement in symptoms, or to seek the care of the closest emergency department if he worsens with the above plan.   1. Pruritic dermatitis  - predniSONE (STERAPRED UNI-PAK 21 TAB) 10 MG (21) TBPK tablet; Take as directed.  Dispense: 21 tablet; Refill: 0 -Take Sterapred as directed. -Continue Benadryl for itching at bedtime. -Take Pepcid  AC 20mg  tablet each morning. -Use Aveeno Oatmeal Colloidal bath, using lukewarm water, at least once daily until symptoms improve. -Follow up as needed.

## 2017-11-20 NOTE — Patient Instructions (Signed)
Pruritus -Take Sterapred as directed. -Continue Benadryl for itching at bedtime. -Take Pepcid AC 20mg  tablet each morning. -Use Aveeno Oatmeal Colloidal bath, using lukewarm water, at least once daily until symptoms improve. -Follow up as needed.   Pruritus is an itching feeling. There are many different conditions and factors that can make your skin itchy. Dry skin is one of the most common causes of itching. Most cases of itching do not require medical attention. Itchy skin can turn into a rash. Follow these instructions at home: Watch your pruritus for any changes. Take these steps to help with your condition: Skin Care  Moisturize your skin as needed. A moisturizer that contains petroleum jelly is best for keeping moisture in your skin.  Take or apply medicines only as directed by your health care provider. This may include: ? Corticosteroid cream. ? Anti-itch lotions. ? Oral anti-histamines.  Apply cool compresses to the affected areas.  Try taking a bath with: ? Epsom salts. Follow the instructions on the packaging. You can get these at your local pharmacy or grocery store. ? Baking soda. Pour a small amount into the bath as directed by your health care provider. ? Colloidal oatmeal. Follow the instructions on the packaging. You can get this at your local pharmacy or grocery store.  Try applying baking soda paste to your skin. Stir water into baking soda until it reaches a paste-like consistency.  Do not scratch your skin.  Avoid hot showers or baths, which can make itching worse. A cold shower may help with itching as long as you use a moisturizer after.  Avoid scented soaps, detergents, and perfumes. Use gentle soaps, detergents, perfumes, and other cosmetic products. General instructions  Avoid wearing tight clothes.  Keep a journal to help track what causes your itch. Write down: ? What you eat. ? What cosmetic products you use. ? What you drink. ? What you wear.  This includes jewelry.  Use a humidifier. This keeps the air moist, which helps to prevent dry skin. Contact a health care provider if:  The itching does not go away after several days.  You sweat at night.  You have weight loss.  You are unusually thirsty.  You urinate more than normal.  You are more tired than normal.  You have abdominal pain.  Your skin tingles.  You feel weak.  Your skin or the whites of your eyes look yellow (jaundice).  Your skin feels numb. This information is not intended to replace advice given to you by your health care provider. Make sure you discuss any questions you have with your health care provider. Document Released: 11/07/2010 Document Revised: 08/03/2015 Document Reviewed: 02/21/2014 Elsevier Interactive Patient Education  2018 [employer].  Rash A rash is a change in the color of the skin. A rash can also change the way your skin feels. There are many different conditions and factors that can cause a rash. Follow these instructions at home: Pay attention to any changes in your symptoms. Follow these instructions to help with your condition: Medicine Take or apply over-the-counter and prescription medicines only as told by your health care provider. These may include:  Corticosteroid cream.  Anti-itch lotions.  Oral antihistamines.  Skin Care  Apply cool compresses to the affected areas.  Try taking a bath with: ? Epsom salts. Follow the instructions on the packaging. You can get these at your local pharmacy or grocery store. ? Baking soda. Pour a small amount into the bath as told by  your health care provider. ? Colloidal oatmeal. Follow the instructions on the packaging. You can get this at your local pharmacy or grocery store.  Try applying baking soda paste to your skin. Stir water into baking soda until it reaches a paste-like consistency.  Do not scratch or rub your skin.  Avoid covering the rash. Make sure the rash  is exposed to air as much as possible. General instructions  Avoid hot showers or baths, which can make itching worse. A cold shower may help.  Avoid scented soaps, detergents, and perfumes. Use gentle soaps, detergents, perfumes, and other cosmetic products.  Avoid any substance that causes your rash. Keep a journal to help track what causes your rash. Write down: ? What you eat. ? What cosmetic products you use. ? What you drink. ? What you wear. This includes jewelry.  Keep all follow-up visits as told by your health care provider. This is important. Contact a health care provider if:  You sweat at night.  You lose weight.  You urinate more than normal.  You feel weak.  You vomit.  Your skin or the whites of your eyes look yellow (jaundice).  Your skin: ? Tingles. ? Is numb.  Your rash: ? Does not go away after several days. ? Gets worse.  You are: ? Unusually thirsty. ? More tired than normal.  You have: ? New symptoms. ? Pain in your abdomen. ? A fever. ? Diarrhea. Get help right away if:  You develop a rash that covers all or most of your body. The rash may or may not be painful.  You develop blisters that: ? Are on top of the rash. ? Grow larger or grow together. ? Are painful. ? Are inside your nose or mouth.  You develop a rash that: ? Looks like purple pinprick-sized spots all over your body. ? Has a "bull's eye" or looks like a target. ? Is not related to sun exposure, is red and painful, and causes your skin to peel. This information is not intended to replace advice given to you by your health care provider. Make sure you discuss any questions you have with your health care provider. Document Released: 02/15/2002 Document Revised: 08/01/2015 Document Reviewed: 07/13/2014 Elsevier Interactive Patient Education  2018 [employer]

## 2017-11-26 DIAGNOSIS — D2271 Melanocytic nevi of right lower limb, including hip: Secondary | ICD-10-CM | POA: Diagnosis not present

## 2017-11-26 DIAGNOSIS — D2272 Melanocytic nevi of left lower limb, including hip: Secondary | ICD-10-CM | POA: Diagnosis not present

## 2017-11-26 DIAGNOSIS — L298 Other pruritus: Secondary | ICD-10-CM | POA: Diagnosis not present

## 2017-11-26 DIAGNOSIS — L738 Other specified follicular disorders: Secondary | ICD-10-CM | POA: Diagnosis not present

## 2017-12-10 DIAGNOSIS — K080 Exfoliation of teeth due to systemic causes: Secondary | ICD-10-CM | POA: Diagnosis not present

## 2018-01-19 ENCOUNTER — Encounter: Payer: Federal, State, Local not specified - PPO | Admitting: Internal Medicine

## 2018-01-27 ENCOUNTER — Ambulatory Visit (INDEPENDENT_AMBULATORY_CARE_PROVIDER_SITE_OTHER): Payer: Federal, State, Local not specified - PPO | Admitting: Internal Medicine

## 2018-01-27 ENCOUNTER — Other Ambulatory Visit (INDEPENDENT_AMBULATORY_CARE_PROVIDER_SITE_OTHER): Payer: Federal, State, Local not specified - PPO

## 2018-01-27 ENCOUNTER — Encounter: Payer: Self-pay | Admitting: Internal Medicine

## 2018-01-27 VITALS — BP 110/70 | HR 57 | Temp 98.3°F | Ht 65.75 in | Wt 151.0 lb

## 2018-01-27 DIAGNOSIS — J3089 Other allergic rhinitis: Secondary | ICD-10-CM | POA: Diagnosis not present

## 2018-01-27 DIAGNOSIS — F325 Major depressive disorder, single episode, in full remission: Secondary | ICD-10-CM

## 2018-01-27 DIAGNOSIS — Z23 Encounter for immunization: Secondary | ICD-10-CM

## 2018-01-27 DIAGNOSIS — Z0000 Encounter for general adult medical examination without abnormal findings: Secondary | ICD-10-CM

## 2018-01-27 LAB — CBC
HCT: 40 % (ref 36.0–46.0)
HEMOGLOBIN: 13.7 g/dL (ref 12.0–15.0)
MCHC: 34.3 g/dL (ref 30.0–36.0)
MCV: 93.3 fl (ref 78.0–100.0)
PLATELETS: 330 10*3/uL (ref 150.0–400.0)
RBC: 4.28 Mil/uL (ref 3.87–5.11)
RDW: 13.1 % (ref 11.5–15.5)
WBC: 6.5 10*3/uL (ref 4.0–10.5)

## 2018-01-27 LAB — COMPREHENSIVE METABOLIC PANEL
ALT: 19 U/L (ref 0–35)
AST: 18 U/L (ref 0–37)
Albumin: 4.4 g/dL (ref 3.5–5.2)
Alkaline Phosphatase: 55 U/L (ref 39–117)
BILIRUBIN TOTAL: 0.6 mg/dL (ref 0.2–1.2)
BUN: 15 mg/dL (ref 6–23)
CO2: 26 meq/L (ref 19–32)
Calcium: 9.5 mg/dL (ref 8.4–10.5)
Chloride: 102 mEq/L (ref 96–112)
Creatinine, Ser: 0.8 mg/dL (ref 0.40–1.20)
GFR: 78.85 mL/min (ref >60.00)
GLUCOSE: 93 mg/dL (ref 70–99)
Potassium: 4 mEq/L (ref 3.5–5.1)
SODIUM: 137 meq/L (ref 135–145)
Total Protein: 7.6 g/dL (ref 6.0–8.3)

## 2018-01-27 LAB — LIPID PANEL
CHOL/HDL RATIO: 4
Cholesterol: 223 mg/dL — ABNORMAL HIGH (ref 0–200)
HDL: 61 mg/dL (ref >39.00)
LDL Cholesterol: 137 mg/dL — ABNORMAL HIGH (ref 0–99)
NONHDL: 162.04
TRIGLYCERIDES: 125 mg/dL (ref 0.0–149.0)
VLDL: 25 mg/dL (ref 0.0–40.0)

## 2018-01-27 LAB — TSH: TSH: 1.32 u[IU]/mL (ref 0.35–4.50)

## 2018-01-27 NOTE — Assessment & Plan Note (Signed)
The patient has moderate depression which is not recurrent. There are not psychotic features. Prior therapy tried none. Current therapy lexapro which is not adjusted today. Return in 1 year.

## 2018-01-27 NOTE — Assessment & Plan Note (Signed)
Flu shot given. Shingrix complete. Tetanus up to date. Colonoscopy up to date. Mammogram up to date, pap smear up to date and dexa up to date. Counseled about sun safety and mole surveillance. Counseled about the dangers of distracted driving. Given 10 year screening recommendations.

## 2018-01-27 NOTE — Patient Instructions (Signed)

## 2018-01-27 NOTE — Assessment & Plan Note (Signed)
Taking allegra and we discussed switch back to zyrtec.

## 2018-01-27 NOTE — Progress Notes (Signed)
   Subjective:    Patient ID: April Vaughn, female    DOB: [birth year], 57 y.o.   MRN: [id number]  HPI The patient is a 57 YO female coming in for physical.   [facility] reviewed and updated.  Review of Systems  Constitutional: Negative.   HENT: Positive for congestion and postnasal drip.   Eyes: Negative.   Respiratory: Negative for cough, chest tightness and shortness of breath.   Cardiovascular: Negative for chest pain, palpitations and leg swelling.  Gastrointestinal: Negative for abdominal distention, abdominal pain, constipation, diarrhea, nausea and vomiting.  Musculoskeletal: Negative.   Skin: Negative.   Neurological: Negative.   Psychiatric/Behavioral: Negative.       Objective:   Physical Exam  Constitutional: She is oriented to person, place, and time. She appears well-developed and well-nourished.  HENT:  Head: Normocephalic and atraumatic.  Eyes: EOM are normal.  Neck: Normal range of motion.  Cardiovascular: Normal rate and regular rhythm.  Pulmonary/Chest: Effort normal and breath sounds normal. No respiratory distress. She has no wheezes. She has no rales.  Abdominal: Soft. Bowel sounds are normal. She exhibits no distension. There is no tenderness. There is no rebound.  Musculoskeletal: She exhibits no edema.  Neurological: She is alert and oriented to person, place, and time. Coordination normal.  Skin: Skin is warm and dry.  Psychiatric: She has a normal mood and affect.   Vitals:   01/27/18 0938  BP: 110/70  Pulse: (!) 57  Temp: 98.3 F (36.8 C)  TempSrc: Oral  SpO2: 98%  Weight: 151 lb (68.5 kg)  Height: 5' 5.75" (1.67 m)      Assessment & Plan:  Flu shot given at visit

## 2018-02-04 ENCOUNTER — Other Ambulatory Visit: Payer: Self-pay | Admitting: Internal Medicine

## 2018-04-07 DIAGNOSIS — K080 Exfoliation of teeth due to systemic causes: Secondary | ICD-10-CM | POA: Diagnosis not present

## 2018-05-14 ENCOUNTER — Other Ambulatory Visit: Payer: Self-pay | Admitting: Certified Nurse Midwife

## 2018-05-14 DIAGNOSIS — A609 Anogenital herpesviral infection, unspecified: Secondary | ICD-10-CM

## 2018-05-15 NOTE — Telephone Encounter (Signed)
[facility], they have refills for her. They will process it now.  Responded to patient via mychart.  Encounter closed.

## 2018-05-15 NOTE — Telephone Encounter (Signed)
Patient sent the following correspondence through MyChart. Routing to refill pool to assist patient with request.  Message   Can you refill my medication Valacyclovir ? It really reduces the outbreak.

## 2018-05-15 NOTE — Telephone Encounter (Signed)
Rerouted to triage. Okay per nurse Noreene Larsson.

## 2018-06-03 ENCOUNTER — Encounter: Payer: Self-pay | Admitting: Internal Medicine

## 2018-08-27 ENCOUNTER — Other Ambulatory Visit: Payer: Self-pay | Admitting: Certified Nurse Midwife

## 2018-08-27 DIAGNOSIS — Z1231 Encounter for screening mammogram for malignant neoplasm of breast: Secondary | ICD-10-CM

## 2018-09-22 ENCOUNTER — Ambulatory Visit (INDEPENDENT_AMBULATORY_CARE_PROVIDER_SITE_OTHER): Payer: Federal, State, Local not specified - PPO | Admitting: Certified Nurse Midwife

## 2018-09-22 ENCOUNTER — Other Ambulatory Visit: Payer: Self-pay

## 2018-09-22 ENCOUNTER — Other Ambulatory Visit (HOSPITAL_COMMUNITY)
Admission: RE | Admit: 2018-09-22 | Discharge: 2018-09-22 | Disposition: A | Discharge status: Home / Self Care | Payer: Federal, State, Local not specified - PPO | Source: Ambulatory Visit | Attending: Cardiology | Admitting: Cardiology

## 2018-09-22 ENCOUNTER — Encounter: Payer: Self-pay | Admitting: Certified Nurse Midwife

## 2018-09-22 VITALS — BP 110/70 | HR 70 | Temp 97.2°F | Resp 16 | Ht 65.5 in | Wt 152.0 lb

## 2018-09-22 DIAGNOSIS — Z01419 Encounter for gynecological examination (general) (routine) without abnormal findings: Secondary | ICD-10-CM

## 2018-09-22 DIAGNOSIS — N951 Menopausal and female climacteric states: Secondary | ICD-10-CM | POA: Diagnosis not present

## 2018-09-22 DIAGNOSIS — Z124 Encounter for screening for malignant neoplasm of cervix: Secondary | ICD-10-CM | POA: Diagnosis not present

## 2018-09-22 DIAGNOSIS — Z79890 Hormone replacement therapy (postmenopausal): Secondary | ICD-10-CM

## 2018-09-22 DIAGNOSIS — A609 Anogenital herpesviral infection, unspecified: Secondary | ICD-10-CM

## 2018-09-22 MED ORDER — VALACYCLOVIR HCL 1 G PO TABS: 1000.0000 mg | ORAL_TABLET | Freq: Every day | ORAL | 12 refills | Status: DC

## 2018-09-22 NOTE — Progress Notes (Signed)
58 y.o. G0P0000 Single  Caucasian Fe here for annual exam. Menopausal on HRT has been taking 1/2 tablet of 0.5  working well, still having some hot flashes and night sweats. No insomnia. No warning signs noted. Continues with Lexapro and taking at the same time as HRT, bedtime.  Sees Dr. Okey Duprerawford for labs and medication management. No partner change or concerns. No other health issues today.  Patient's last menstrual period was 04/12/2011.          Sexually active: Yes.    The current method of family planning is post menopausal status.  Female partner  Exercising: No.  exercise Smoker:  no  Review of Systems  Constitutional: Negative.   HENT: Negative.   Eyes: Negative.   Respiratory: Negative.   Cardiovascular: Negative.   Gastrointestinal: Negative.   Genitourinary: Negative.   Musculoskeletal: Negative.   Skin: Negative.   Neurological: Negative.   Endo/Heme/Allergies: Negative.   Psychiatric/Behavioral: Negative.     Health Maintenance: Pap:  09-10-16 neg History of Abnormal Pap: no MMG:  09-05-17 category c density birads 1:neg, scheduled for tomorrow Self Breast exams: no Colonoscopy:  2017 f/u 6161yrs BMD:   none TDaP:  2013 Shingles: 2019 Pneumonia: no Hep C and HIV: Hep c neg 2016, HIV neg 2014 Labs: if needed. Sees Dr. Okey Duprerawford for labs and aex   reports that she has never smoked. She has never used smokeless tobacco. She reports current alcohol use of about 4.0 standard drinks of alcohol per week. She reports that she does not use drugs.  Past Medical History:  Diagnosis Date  . Anxiety   . 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 Chloasma   . Depression   . Diverticulosis 2012  . History of PCR DNA positive for HSV1     Past Surgical History:  Procedure Laterality Date  . COLONOSCOPY  09/07/2010  . tooth implant  2019   bone graft  . vericose vein Right 06/2011    Current Outpatient Medications  Medication Sig Dispense Refill  . escitalopram (LEXAPRO) 10 MG  tablet TAKE 1 TABLET ONCE DAILY. 90 tablet 3  . estradiol (ESTRACE) 0.5 MG tablet One half tablet daily 90 tablet 3  . Fexofenadine HCl (ALLEGRA ALLERGY PO) Take by mouth daily.    . IBU 600 MG tablet     . progesterone (PROMETRIUM) 100 MG capsule Take 1 capsule (100 mg total) by mouth daily. 30 capsule 12  . UNABLE TO FIND Cream for fungus    . valACYclovir (VALTREX) 1000 MG tablet Take 1 tablet (1,000 mg total) by mouth daily. 30 tablet 12   No current facility-administered medications for this visit.     Family History  Problem Relation Age of Onset  . Coronary artery disease Mother [id number]       CABG, died 274 due to MI  . Aneurysm Mother        behind eye  . Hypertension Father   . Coronary artery disease Father        CABG  . Kidney failure Father   . Coronary artery disease Sister [id number]       MI, 9 stents  . Hyperlipidemia Sister   . Crohn's disease Sister [id number]  . Polymyalgia rheumatica Sister        PMR  . Colon cancer Maternal Grandfather 62    ROS:  Pertinent items are noted in HPI.  Otherwise, a comprehensive ROS was negative.  Exam:   BP 110/70  Pulse 70   Temp (!) 97.2 F (36.2 C) (Skin)   Resp 16   Ht 5' 5.5" (1.664 m)   Wt 152 lb (68.9 kg)   LMP 04/12/2011   BMI 24.91 kg/m  Height: 5' 5.5" (166.4 cm) Ht Readings from Last 3 Encounters:  09/22/18 5' 5.5" (1.664 m)  01/27/18 5' 5.75" (1.67 m)  11/03/17 5' 5.75" (1.67 m)    General appearance: alert, cooperative and appears stated age Head: Normocephalic, without obvious abnormality, atraumatic Neck: no adenopathy, supple, symmetrical, trachea midline and thyroid normal to inspection and palpation Lungs: clear to auscultation bilaterally Breasts: normal appearance, no masses or tenderness, No nipple retraction or dimpling, No nipple discharge or bleeding, No axillary or supraclavicular adenopathy Heart: regular rate and rhythm Abdomen: soft, non-tender; no masses,  no organomegaly Extremities: extremities  normal, atraumatic, no cyanosis or edema Skin: Skin color, texture, turgor normal. No rashes or lesions Lymph nodes: Cervical, supraclavicular, and axillary nodes normal. No abnormal inguinal nodes palpated Neurologic: Grossly normal   Pelvic: External genitalia:  no lesions              Urethra:  normal appearing urethra with no masses, tenderness or lesions              Bartholin's and Skene's: normal                 Vagina: normal appearing vagina with normal color and discharge, no lesions              Cervix: no cervical motion tenderness, no lesions and normal appearance              Pap taken: Yes.   Bimanual Exam:  Uterus:  normal size, contour, position, consistency, mobility, non-tender and anteverted              Adnexa: normal adnexa and no mass, fullness, tenderness               Rectovaginal: Confirms               Anus:  normal sphincter tone, no lesions  Chaperone present: yes  A:  Well Woman with normal exam  Menopausal on HRT doing well on reduced dosage, desires continuance  History of HSV 1 needs Valtrex update  Depression on medication with PCP management, stable per patient  Mammogram due    P:   Reviewed health and wellness pertinent to exam  Discussed risks/benefits/warning signs with HRT and recommend trying to wean off as soon as her symptoms decrease due to family history of CAD. Patient aware and agrees with plan.  Will renew once mammogram in.   Rx Valtrex see order with instructions  Continue follow up with PCP as indicated  Stressed importance of mammogram and SBE, card given to hang in shower  Pap smear: yes   counseled on breast self exam, mammography screening, STD prevention, feminine hygiene, use and side effects of HRT, adequate intake of calcium and vitamin D, diet and exercise  return annually or prn  An After Visit Summary was printed and given to the patient.

## 2018-09-23 ENCOUNTER — Ambulatory Visit
Admission: RE | Admit: 2018-09-23 | Discharge: 2018-09-23 | Disposition: A | Discharge status: Home / Self Care | Payer: Federal, State, Local not specified - PPO | Source: Ambulatory Visit | Attending: Certified Nurse Midwife | Admitting: Certified Nurse Midwife

## 2018-09-23 DIAGNOSIS — Z1231 Encounter for screening mammogram for malignant neoplasm of breast: Secondary | ICD-10-CM | POA: Diagnosis not present

## 2018-09-24 LAB — CYTOLOGY - PAP
Adequacy: ABSENT
Diagnosis: NEGATIVE
HPV: NOT DETECTED

## 2018-10-07 ENCOUNTER — Other Ambulatory Visit: Payer: Self-pay | Admitting: Certified Nurse Midwife

## 2018-10-07 DIAGNOSIS — N951 Menopausal and female climacteric states: Secondary | ICD-10-CM

## 2018-10-07 NOTE — Telephone Encounter (Signed)
Medication refill request: progesterone Last AEX:  09/22/18 Next AEX: 09/24/19 Last MMG (if hormonal medication request): 09/23/18 Bi-rads 1 Neg Refill authorized: #[id number] with 3 RF

## 2018-10-11 ENCOUNTER — Other Ambulatory Visit: Payer: Self-pay | Admitting: Certified Nurse Midwife

## 2018-10-11 DIAGNOSIS — N951 Menopausal and female climacteric states: Secondary | ICD-10-CM

## 2018-10-12 NOTE — Telephone Encounter (Signed)
Medication refill request: Progesterone 100 mg daily Last AEX:  09/22/2018 Next AEX: 09/14/2019 Last MMG (if hormonal medication request): 09/23/2018 Birads 1 negative Refill authorized:  Refused. This was sent in on 10/07/2018 by Melvia Heaps CNM #[id number]

## 2018-10-14 DIAGNOSIS — H00014 Hordeolum externum left upper eyelid: Secondary | ICD-10-CM | POA: Diagnosis not present

## 2018-10-21 DIAGNOSIS — H6123 Impacted cerumen, bilateral: Secondary | ICD-10-CM | POA: Diagnosis not present

## 2018-10-21 DIAGNOSIS — J310 Chronic rhinitis: Secondary | ICD-10-CM | POA: Diagnosis not present

## 2018-11-18 ENCOUNTER — Encounter: Payer: Self-pay | Admitting: Internal Medicine

## 2018-12-04 ENCOUNTER — Other Ambulatory Visit: Payer: Self-pay | Admitting: Certified Nurse Midwife

## 2018-12-04 DIAGNOSIS — N951 Menopausal and female climacteric states: Secondary | ICD-10-CM

## 2018-12-04 NOTE — Telephone Encounter (Signed)
Medication refill request: estrace Last AEX:  09/22/18 Next AEX: 09/24/19 Last MMG (if hormonal medication request): 09/24/18 bi-rads 1 neg  Refill authorized: #45 with 4 rf

## 2018-12-16 DIAGNOSIS — M720 Palmar fascial fibromatosis [Dupuytren]: Secondary | ICD-10-CM | POA: Diagnosis not present

## 2018-12-17 DIAGNOSIS — M720 Palmar fascial fibromatosis [Dupuytren]: Secondary | ICD-10-CM | POA: Diagnosis not present

## 2018-12-24 DIAGNOSIS — M720 Palmar fascial fibromatosis [Dupuytren]: Secondary | ICD-10-CM | POA: Diagnosis not present

## 2019-01-13 DIAGNOSIS — L720 Epidermal cyst: Secondary | ICD-10-CM | POA: Diagnosis not present

## 2019-01-13 DIAGNOSIS — D499 Neoplasm of unspecified behavior of unspecified site: Secondary | ICD-10-CM | POA: Diagnosis not present

## 2019-01-13 DIAGNOSIS — D2261 Melanocytic nevi of right upper limb, including shoulder: Secondary | ICD-10-CM | POA: Diagnosis not present

## 2019-01-13 DIAGNOSIS — D2262 Melanocytic nevi of left upper limb, including shoulder: Secondary | ICD-10-CM | POA: Diagnosis not present

## 2019-01-13 DIAGNOSIS — L821 Other seborrheic keratosis: Secondary | ICD-10-CM | POA: Diagnosis not present

## 2019-01-13 DIAGNOSIS — D485 Neoplasm of uncertain behavior of skin: Secondary | ICD-10-CM | POA: Diagnosis not present

## 2019-01-13 DIAGNOSIS — L820 Inflamed seborrheic keratosis: Secondary | ICD-10-CM | POA: Diagnosis not present

## 2019-01-29 ENCOUNTER — Encounter: Payer: Federal, State, Local not specified - PPO | Admitting: Internal Medicine

## 2019-02-08 DIAGNOSIS — D485 Neoplasm of uncertain behavior of skin: Secondary | ICD-10-CM | POA: Diagnosis not present

## 2019-02-08 DIAGNOSIS — L988 Other specified disorders of the skin and subcutaneous tissue: Secondary | ICD-10-CM | POA: Diagnosis not present

## 2019-02-17 ENCOUNTER — Other Ambulatory Visit: Payer: Self-pay

## 2019-02-17 DIAGNOSIS — Z20822 Contact with and (suspected) exposure to covid-19: Secondary | ICD-10-CM

## 2019-02-19 LAB — NOVEL CORONAVIRUS, NAA: SARS-CoV-2, NAA: NOT DETECTED

## 2019-03-09 ENCOUNTER — Telehealth: Payer: Self-pay | Admitting: Internal Medicine

## 2019-03-10 ENCOUNTER — Encounter: Payer: Self-pay | Admitting: Internal Medicine

## 2019-03-11 NOTE — Addendum Note (Signed)
Addended by: Jefferson Fuel on: 03/11/2019 11:30 AM   Modules accepted: Orders

## 2019-03-11 NOTE — Telephone Encounter (Signed)
Pt just scheduled virtual appt for 03/17/2019, requesting refill for "hopefully today"

## 2019-03-16 NOTE — Telephone Encounter (Signed)
Pt stated she went to her pharmacy but she was advised that they had not received the approval to refill the medication. Pt stated she is completely out and would like the Rx to be sent to her pharmacy today.

## 2019-03-16 NOTE — Telephone Encounter (Signed)
No refill granted. Pt has not been seen since 2019 and appointment is needed first. LVM informing pt.

## 2019-03-17 ENCOUNTER — Ambulatory Visit (INDEPENDENT_AMBULATORY_CARE_PROVIDER_SITE_OTHER): Payer: Federal, State, Local not specified - PPO | Admitting: Internal Medicine

## 2019-03-17 ENCOUNTER — Encounter: Payer: Self-pay | Admitting: Internal Medicine

## 2019-03-17 DIAGNOSIS — F325 Major depressive disorder, single episode, in full remission: Secondary | ICD-10-CM | POA: Diagnosis not present

## 2019-03-17 DIAGNOSIS — Z0000 Encounter for general adult medical examination without abnormal findings: Secondary | ICD-10-CM

## 2019-03-17 MED ORDER — ESCITALOPRAM OXALATE 10 MG PO TABS: 10.0000 mg | ORAL_TABLET | Freq: Every day | ORAL | 3 refills | Status: DC

## 2019-03-17 NOTE — Progress Notes (Signed)
Virtual Visit via Audio Note  I connected with April Vaughn on 03/17/19 at  8:40 AM EST by an audio-only enabled telemedicine application and verified that I am speaking with the correct person using two identifiers.  The patient and the provider were at separate locations throughout the entire encounter.   I discussed the limitations of evaluation and management by telemedicine and the availability of in person appointments. The patient expressed understanding and agreed to proceed. The patient and the provider were the only parties present for the visit unless noted in HPI below.  History of Present Illness: The patient is a 59 y.o. female with visit for follow up depression. Started as she ran out of lexapro about 1 week ago. She was not able to get refills without a visit. Since that time mood has been okay. She is having more regular hot flashes. Almost nightly and bothering her a lot. Denies SI/HI. Overall it is stable.   Observations/Objective: Voice strong, A and O times 3, no cough or dyspnea during visit  Assessment and Plan: See problem oriented charting  Follow Up Instructions: refill lexapro, ordered labs for when she feels comfortable coming  Visit time 12 minutes:  that time was spent in face to face counseling and coordination of care with the patient: counseled about as above   I discussed the assessment and treatment plan with the patient. The patient was provided an opportunity to ask questions and all were answered. The patient agreed with the plan and demonstrated an understanding of the instructions.   The patient was advised to call back or seek an in-person evaluation if the symptoms worsen or if the condition fails to improve as anticipated.  Myrlene Broker, MD

## 2019-03-17 NOTE — Assessment & Plan Note (Signed)
Refilled lexapro for depression and concurrent hot flashes. She realized it was helping her hot flashes as recurrent once she stopped.

## 2019-05-31 ENCOUNTER — Encounter: Payer: Self-pay | Admitting: Certified Nurse Midwife

## 2019-08-02 ENCOUNTER — Other Ambulatory Visit: Payer: Self-pay

## 2019-08-02 DIAGNOSIS — N951 Menopausal and female climacteric states: Secondary | ICD-10-CM

## 2019-08-02 NOTE — Telephone Encounter (Signed)
Her aex was 09-22-2018

## 2019-08-02 NOTE — Telephone Encounter (Signed)
Since has been almost two years since had visit, needs AEX scheduled.  Can RF when appt scheduled.

## 2019-08-02 NOTE — Telephone Encounter (Signed)
Medication refill request: estradiol 0.5mg  take 1/2 qd Last AEX:  09-22-2018 Next AEX: not scheduled yet, pharmacy note placed for patient to call to schedule yearly exam Last MMG (if hormonal medication request): 09-24-2018 category b density birads 1:neg Refill authorized: please approve if appropriate

## 2019-08-04 MED ORDER — ESTRADIOL 0.5 MG PO TABS: ORAL_TABLET | ORAL | 0 refills | Status: DC

## 2019-08-04 NOTE — Telephone Encounter (Signed)
RF completed.  Thanks for the clarification.

## 2019-08-26 DIAGNOSIS — S0501XA Injury of conjunctiva and corneal abrasion without foreign body, right eye, initial encounter: Secondary | ICD-10-CM | POA: Diagnosis not present

## 2019-09-24 ENCOUNTER — Other Ambulatory Visit: Payer: Self-pay | Admitting: *Deleted

## 2019-09-24 ENCOUNTER — Ambulatory Visit: Payer: Federal, State, Local not specified - PPO | Admitting: Certified Nurse Midwife

## 2019-09-24 DIAGNOSIS — A609 Anogenital herpesviral infection, unspecified: Secondary | ICD-10-CM

## 2019-09-24 MED ORDER — VALACYCLOVIR HCL 1 G PO TABS: 1000.0000 mg | ORAL_TABLET | Freq: Every day | ORAL | 4 refills | Status: DC

## 2019-09-24 NOTE — Telephone Encounter (Signed)
Medication refill request: Valtrex Last AEX:  09-22-2018 DL  Next AEX: 37-5-43 JJ  Last MMG (if hormonal medication request): n/a Refill authorized: Today, please advise.   Medication pended for #30, 4RF. Please refill if appropriate.

## 2019-10-26 DIAGNOSIS — M7661 Achilles tendinitis, right leg: Secondary | ICD-10-CM | POA: Diagnosis not present

## 2019-11-03 ENCOUNTER — Encounter: Payer: Self-pay | Admitting: Internal Medicine

## 2019-11-12 ENCOUNTER — Other Ambulatory Visit: Payer: Self-pay

## 2019-11-12 NOTE — Telephone Encounter (Signed)
Medication refill request: Progesterone  Last AEX:  09/22/18 DL Next AEX: 54/4/92 JJ Last MMG (if hormonal medication request): 09/23/18 BIRADS 1 negative/density b Refill authorized: Today, please advise

## 2019-11-15 MED ORDER — PROGESTERONE MICRONIZED 100 MG PO CAPS: 100.0000 mg | ORAL_CAPSULE | Freq: Every day | ORAL | 0 refills | Status: DC

## 2019-12-16 ENCOUNTER — Other Ambulatory Visit: Payer: Self-pay | Admitting: Obstetrics & Gynecology

## 2019-12-16 DIAGNOSIS — N951 Menopausal and female climacteric states: Secondary | ICD-10-CM

## 2019-12-16 NOTE — Telephone Encounter (Signed)
Medication refill request: Estradiol 5mg   Last AEX:  09/22/18 Next AEX: 02/09/20 Last MMG (if hormonal medication request): 09/23/18 Neg  Refill authorized: 45/0

## 2019-12-17 ENCOUNTER — Other Ambulatory Visit: Payer: Self-pay | Admitting: Obstetrics & Gynecology

## 2019-12-28 ENCOUNTER — Other Ambulatory Visit: Payer: Self-pay | Admitting: Obstetrics and Gynecology

## 2019-12-28 DIAGNOSIS — Z1231 Encounter for screening mammogram for malignant neoplasm of breast: Secondary | ICD-10-CM

## 2020-01-11 DIAGNOSIS — S39012A Strain of muscle, fascia and tendon of lower back, initial encounter: Secondary | ICD-10-CM | POA: Diagnosis not present

## 2020-02-01 ENCOUNTER — Other Ambulatory Visit: Payer: Self-pay

## 2020-02-01 ENCOUNTER — Ambulatory Visit
Admission: RE | Admit: 2020-02-01 | Discharge: 2020-02-01 | Disposition: A | Discharge status: Home / Self Care | Payer: Federal, State, Local not specified - PPO | Source: Ambulatory Visit | Attending: Obstetrics and Gynecology | Admitting: Obstetrics and Gynecology

## 2020-02-01 DIAGNOSIS — Z1231 Encounter for screening mammogram for malignant neoplasm of breast: Secondary | ICD-10-CM

## 2020-02-07 NOTE — Progress Notes (Signed)
59 y.o. G0P0000 Single White or Caucasian Not Hispanic or Latino female here for annual exam.  Would like to talk  Hormone therapy. She is on low dose HRT.  She went on HRT around menopause, ~10 years ago. Last year her dose was reduced.  On her current dose of HRT, occasional hot flashes in the am. She wakes up 1 x a night, sweaty when she wakes up. Able to go back to sleep. Feels rested during the day, mostly.  Feels more irritable on the lower dose of HRT.  Sexually active (female partner), no pain with sex. No vaginal bleeding. No bowel or bladder c/o.      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   Patient's last menstrual period was 04/12/2011.          Sexually active: Yes.    With a Female  The current method of family planning is none.    Exercising: Yes.    Biking  Smoker:  no  Health Maintenance: Pap:09/22/18  WNL Hr HPV Neg 09-10-16 neg  History of abnormal Pap:  no MMG:  02/05/20 density B Bi-rads 1 neg  BMD:   None  Colonoscopy: 2017 f/u 5 year, scheduled in the spring 2022 TDaP:  2013 Gardasil: NA   reports that she has never smoked. She has never used smokeless tobacco. She reports current alcohol use of about 4.0 standard drinks of alcohol per week. She reports that she does not use drugs. 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 Lives with her partner.   Past Medical History:  Diagnosis Date  . Anxiety   . 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 Chloasma   . Depression   . Diverticulosis 2012  . History of PCR DNA positive for HSV1     Past Surgical History:  Procedure Laterality Date  . COLONOSCOPY  09/07/2010  . tooth implant  2019   bone graft  . vericose vein Right 06/2011    Current Outpatient Medications  Medication Sig Dispense Refill  . escitalopram (LEXAPRO) 10 MG tablet Take 1 tablet (10 mg total) by mouth daily. 90 tablet 3  . estradiol (ESTRACE) 0.5 MG tablet TAKE 1/2 TABLET EVERY DAY. 45 tablet 0  . Fexofenadine HCl (ALLEGRA ALLERGY PO) Take by  mouth daily.    . IBU 600 MG tablet     . meloxicam (MOBIC) 15 MG tablet Take 15 mg by mouth daily.    . progesterone (PROMETRIUM) 100 MG capsule Take 1 capsule (100 mg total) by mouth daily. 90 capsule 0  . valACYclovir (VALTREX) 1000 MG tablet Take 1 tablet (1,000 mg total) by mouth daily. 30 tablet 4   No current facility-administered medications for this visit.    Family History  Problem Relation Age of Onset  . Coronary artery disease Mother 49       CABG, died 30 due to MI  . Aneurysm Mother        behind eye  . Hypertension Father   . Coronary artery disease Father        CABG  . Kidney failure Father   . Coronary artery disease Sister 52       MI, 9 stents  . Hyperlipidemia Sister   . Crohn's disease Sister 63  . Polymyalgia rheumatica Sister        PMR  . Colon cancer Maternal Grandfather 53  Mom, dad and sister all smokers.   Review of Systems  All other systems reviewed  and are negative.   Exam:   BP 122/72   Pulse 72   Ht 5\' 5"  (1.651 m)   Wt 156 lb (70.8 kg)   LMP 04/12/2011   SpO2 98%   BMI 25.96 kg/m   Weight change: @WEIGHTCHANGE @ Height:   Height: 5\' 5"  (165.1 cm)  Ht Readings from Last 3 Encounters:  02/09/20 5\' 5"  (1.651 m)  09/22/18 5' 5.5" (1.664 m)  01/27/18 5' 5.75" (1.67 m)    General appearance: alert, cooperative and appears stated age Head: Normocephalic, without obvious abnormality, atraumatic Neck: no adenopathy, supple, symmetrical, trachea midline and thyroid normal to inspection and palpation Lungs: clear to auscultation bilaterally Cardiovascular: regular rate and rhythm Breasts: normal appearance, no masses or tenderness Abdomen: soft, non-tender; non distended,  no masses,  no organomegaly Extremities: extremities normal, atraumatic, no cyanosis or edema Skin: Skin color, texture, turgor normal. No rashes or lesions Lymph nodes: Cervical, supraclavicular, and axillary nodes normal. No abnormal inguinal nodes  palpated Neurologic: Grossly normal   Pelvic: External genitalia:  no lesions              Urethra:  normal appearing urethra with no masses, tenderness or lesions              Bartholins and Skenes: normal                 Vagina: normal appearing vagina with normal color and discharge, no lesions              Cervix: no lesions               Bimanual Exam:  Uterus:  normal size, contour, position, consistency, mobility, non-tender              Adnexa: no mass, fullness, tenderness               Rectovaginal: Confirms               Anus:  normal sphincter tone, no lesions  chaperoned for the exam.  A:  Well Woman with normal exam  On HRT, dose decreased last year, wants to go back up. Having vasomotor symptoms and irritability on the lower dose.   H/O elevated LDL  H/O low vit d  P:   No pap this year  Screening labs, vit d  Discussed breast self exam  Discussed calcium and vit D intake  Will adjust her HRT to transdermal and increase the dose of estrogen. No contraindications  to HRT, reviewed the risks, information given (mychart)  Colonoscopy in the spring

## 2020-02-09 ENCOUNTER — Other Ambulatory Visit: Payer: Self-pay

## 2020-02-09 ENCOUNTER — Encounter: Payer: Self-pay | Admitting: Obstetrics and Gynecology

## 2020-02-09 ENCOUNTER — Ambulatory Visit: Payer: Federal, State, Local not specified - PPO | Admitting: Obstetrics and Gynecology

## 2020-02-09 VITALS — BP 122/72 | HR 72 | Ht 65.0 in | Wt 156.0 lb

## 2020-02-09 DIAGNOSIS — Z79890 Hormone replacement therapy (postmenopausal): Secondary | ICD-10-CM | POA: Diagnosis not present

## 2020-02-09 DIAGNOSIS — Z01419 Encounter for gynecological examination (general) (routine) without abnormal findings: Secondary | ICD-10-CM | POA: Diagnosis not present

## 2020-02-09 DIAGNOSIS — Z0000 Encounter for general adult medical examination without abnormal findings: Secondary | ICD-10-CM

## 2020-02-09 DIAGNOSIS — E7800 Pure hypercholesterolemia, unspecified: Secondary | ICD-10-CM | POA: Diagnosis not present

## 2020-02-09 DIAGNOSIS — Z8639 Personal history of other endocrine, nutritional and metabolic disease: Secondary | ICD-10-CM | POA: Diagnosis not present

## 2020-02-09 DIAGNOSIS — A609 Anogenital herpesviral infection, unspecified: Secondary | ICD-10-CM

## 2020-02-09 MED ORDER — VALACYCLOVIR HCL 1 G PO TABS: ORAL_TABLET | ORAL | 1 refills | Status: DC

## 2020-02-09 MED ORDER — PROGESTERONE MICRONIZED 100 MG PO CAPS
100.0000 mg | ORAL_CAPSULE | Freq: Every day | ORAL | 3 refills | Status: DC
Start: 2020-02-09 — End: 2021-02-14

## 2020-02-09 MED ORDER — ESTRADIOL 0.025 MG/24HR TD PTTW: 1.0000 | MEDICATED_PATCH | TRANSDERMAL | 3 refills | Status: DC

## 2020-02-09 NOTE — Patient Instructions (Signed)
EXERCISE   We recommended that you start or continue a regular exercise program for good health. Physical activity is anything that gets your body moving, some is better than none. The CDC recommends 150 minutes per week of Moderate-Intensity Aerobic Activity and 2 or more days of Muscle Strengthening Activity.  Benefits of exercise are limitless: helps weight loss/weight maintenance, improves mood and energy, helps with depression and anxiety, improves sleep, tones and strengthens muscles, improves balance, improves bone density, protects from chronic conditions such as heart disease, high blood pressure and diabetes and so much more. To learn more visit: https://www.cdc.gov/physicalactivity/index.html  DIET: Good nutrition starts with a healthy diet of fruits, vegetables, whole grains, and lean protein sources. Drink plenty of water for hydration. Minimize empty calories, sodium, sweets. For more information about dietary recommendations visit: https://health.gov/our-work/nutrition-physical-activity/dietary-guidelines and https://www.myplate.gov/  ALCOHOL:  Women should limit their alcohol intake to no more than 7 drinks/beers/glasses of wine (combined, not each!) per week. Moderation of alcohol intake to this level decreases your risk of breast cancer and liver damage.  If you are concerned that you may have a problem, or your friends have told you they are concerned about your drinking, there are many resources to help. A well-known program that is free, effective, and available to all people all over the nation is Alcoholics Anonymous.  Check out this site to learn more: https://www.aa.org/   CALCIUM AND VITAMIN D:  Adequate intake of calcium and Vitamin D are recommended for bone health.  The recommendations for exact amounts of these supplements seem to change often, but generally speaking 1000-1500 mg of calcium (between diet and supplement) and 800 units of Vitamin D per day seems prudent.      PAP SMEARS:  Pap smears, to check for cervical cancer or precancers,  have traditionally been done yearly, although recent scientific advances have shown that most women can have pap smears less often.  However, every woman still should have a physical exam from her gynecologist every year. It will include a breast check, inspection of the vulva and vagina to check for abnormal growths or skin changes, a visual exam of the cervix, and then an exam to evaluate the size and shape of the uterus and ovaries.  And after 58 years of age, a rectal exam is indicated to check for rectal cancers. We will also provide age appropriate advice regarding health maintenance, like when you should have certain vaccines, screening for sexually transmitted diseases, bone density testing, colonoscopy, mammograms, etc.   MAMMOGRAMS:  All women over 40 years old should have a routine mammogram.   COLON CANCER SCREENING: Now recommend starting at age 45. At this time colonoscopy is not covered for routine screening until 50. There are take home tests that can be done between 45-49.   COLONOSCOPY:  Colonoscopy to screen for colon cancer is recommended for all women at age 50.  We know, you hate the idea of the prep.  We agree, BUT, having colon cancer and not knowing it is worse!!  Colon cancer so often starts as a polyp that can be seen and removed at colonscopy, which can quite literally save your life!  And if your first colonoscopy is normal and you have no family history of colon cancer, most women don't have to have it again for 10 years.  Once every ten years, you can do something that may end up saving your life, right?  We will be happy to help you get it scheduled when   you are ready.  Be sure to check your insurance coverage so you understand how much it will cost.  It may be covered as a preventative service at no cost, but you should check your particular policy.      Breast Self-Awareness Breast self-awareness  means being familiar with how your breasts look and feel. It involves checking your breasts regularly and reporting any changes to your health care provider. Practicing breast self-awareness is important. A change in your breasts can be a sign of a serious medical problem. Being familiar with how your breasts look and feel allows you to find any problems early, when treatment is more likely to be successful. All women should practice breast self-awareness, including women who have had breast implants. How to do a breast self-exam One way to learn what is normal for your breasts and whether your breasts are changing is to do a breast self-exam. To do a breast self-exam: Look for Changes  1. Remove all the clothing above your waist. 2. Stand in front of a mirror in a room with good lighting. 3. Put your hands on your hips. 4. Push your hands firmly downward. 5. Compare your breasts in the mirror. Look for differences between them (asymmetry), such as: ? Differences in shape. ? Differences in size. ? Puckers, dips, and bumps in one breast and not the other. 6. Look at each breast for changes in your skin, such as: ? Redness. ? Scaly areas. 7. Look for changes in your nipples, such as: ? Discharge. ? Bleeding. ? Dimpling. ? Redness. ? A change in position. Feel for Changes Carefully feel your breasts for lumps and changes. It is best to do this while lying on your back on the floor and again while sitting or standing in the shower or tub with soapy water on your skin. Feel each breast in the following way:  Place the arm on the side of the breast you are examining above your head.  Feel your breast with the other hand.  Start in the nipple area and make  inch (2 cm) overlapping circles to feel your breast. Use the pads of your three middle fingers to do this. Apply light pressure, then medium pressure, then firm pressure. The light pressure will allow you to feel the tissue closest to the  skin. The medium pressure will allow you to feel the tissue that is a little deeper. The firm pressure will allow you to feel the tissue close to the ribs.  Continue the overlapping circles, moving downward over the breast until you feel your ribs below your breast.  Move one finger-width toward the center of the body. Continue to use the  inch (2 cm) overlapping circles to feel your breast as you move slowly up toward your collarbone.  Continue the up and down exam using all three pressures until you reach your armpit.  Write Down What You Find  Write down what is normal for each breast and any changes that you find. Keep a written record with breast changes or normal findings for each breast. By writing this information down, you do not need to depend only on memory for size, tenderness, or location. Write down where you are in your menstrual cycle, if you are still menstruating. If you are having trouble noticing differences in your breasts, do not get discouraged. With time you will become more familiar with the variations in your breasts and more comfortable with the exam. How often should I examine   my breasts? Examine your breasts every month. If you are breastfeeding, the best time to examine your breasts is after a feeding or after using a breast pump. If you menstruate, the best time to examine your breasts is 5-7 days after your period is over. During your period, your breasts are lumpier, and it may be more difficult to notice changes. When should I see my health care provider? See your health care provider if you notice:  A change in shape or size of your breasts or nipples.  A change in the skin of your breast or nipples, such as a reddened or scaly area.  Unusual discharge from your nipples.  A lump or thick area that was not there before.  Pain in your breasts.  Anything that concerns you.   Menopause and Hormone Replacement Therapy Menopause is a normal time of life  when menstrual periods stop completely and the ovaries stop producing the female hormones estrogen and progesterone. This lack of hormones can affect your health and cause undesirable symptoms. Hormone replacement therapy (HRT) can relieve some of those symptoms. What is hormone replacement therapy? HRT is the use of artificial (synthetic) hormones to replace hormones that your body has stopped producing because you have reached menopause. What are my options for HRT?  HRT may consist of the synthetic hormones estrogen and progestin, or it may consist of only estrogen (estrogen-only therapy). You and your health care provider will decide which form of HRT is best for you. If you choose to be on HRT and you have a uterus, estrogen and progestin are usually prescribed. Estrogen-only therapy is used for women who do not have a uterus. Possible options for taking HRT include:  Pills.  Patches.  Gels.  Sprays.  Vaginal cream.  Vaginal rings.  Vaginal inserts. The amount of hormone(s) that you take and how long you take the hormone(s) varies according to your health. It is important to:  Begin HRT with the lowest possible dosage.  Stop HRT as soon as your health care provider tells you to stop.  Work with your health care provider so that you feel informed and comfortable with your decisions. What are the benefits of HRT? HRT can reduce the frequency and severity of menopausal symptoms. Benefits of HRT vary according to the kind of symptoms that you have, how severe they are, and your overall health. HRT may help to improve the following symptoms of menopause:  Hot flashes and night sweats. These are sudden feelings of heat that spread over the face and body. The skin may turn red, like a blush. Night sweats are hot flashes that happen while you are sleeping or trying to sleep.  Bone loss (osteoporosis). The body loses calcium more quickly after menopause, causing the bones to become  weaker. This can increase the risk for bone breaks (fractures).  Vaginal dryness. The lining of the vagina can become thin and dry, which can cause pain during sex or cause infection, burning, or itching.  Urinary tract infections.  Urinary incontinence. This is the inability to control when you pass urine.  Irritability.  Short-term memory problems. What are the risks of HRT? Risks of HRT vary depending on your individual health and medical history. Risks of HRT also depend on whether you receive both estrogen and progestin or you receive estrogen only. HRT may increase the risk of:  Spotting. This is when a small amount of blood leaks from the vagina unexpectedly.  Endometrial cancer. This cancer is   in the lining of the uterus (endometrium).  Breast cancer.  Increased density of breast tissue. This can make it harder to find breast cancer on a breast X-ray (mammogram).  Stroke.  Heart disease.  Blood clots.  Gallbladder disease.  Liver disease. Risks of HRT can increase if you have any of the following conditions:  Endometrial cancer.  Liver disease.  Heart disease.  Breast cancer.  History of blood clots.  History of stroke. Follow these instructions at home:  Take over-the-counter and prescription medicines only as told by your health care provider.  Get mammograms, pelvic exams, and medical checkups as often as told by your health care provider.  Have Pap tests done as often as told by your health care provider. A Pap test is sometimes called a Pap smear. It is a screening test that is used to check for signs of cancer of the cervix and vagina. A Pap test can also identify the presence of infection or precancerous changes. Pap tests may be done: ? Every 3 years, starting at age 21. ? Every 5 years, starting after age 30, in combination with testing for human papillomavirus (HPV). ? More often or less often depending on other medical conditions you have, your  age, and other risk factors.  It is up to you to get the results of your Pap test. Ask your health care provider, or the department that is doing the test, when your results will be ready.  Keep all follow-up visits as told by your health care provider. This is important. Contact a health care provider if you have:  Pain or swelling in your legs.  Shortness of breath.  Chest pain.  Lumps or changes in your breasts or armpits.  Slurred speech.  Pain, burning, or bleeding when you urinate.  Unusual vaginal bleeding.  Dizziness or headaches.  Weakness or numbness in any part of your arms or legs.  Pain in your abdomen. Summary  Menopause is a normal time of life when menstrual periods stop completely and the ovaries stop producing the female hormones estrogen and progesterone.  Hormone replacement therapy (HRT) can relieve some of the symptoms of menopause.  HRT can reduce the frequency and severity of menopausal symptoms.  Risks of HRT vary depending on your individual health and medical history. This information is not intended to replace advice given to you by your health care provider. Make sure you discuss any questions you have with your health care provider. Document Revised: 10/28/2017 Document Reviewed: 10/28/2017 [employer] Patient Education  2020 [employer]  

## 2020-02-10 LAB — COMPREHENSIVE METABOLIC PANEL
ALT: 23 IU/L (ref 0–32)
AST: 19 IU/L (ref 0–40)
Albumin/Globulin Ratio: 1.4 (ref 1.2–2.2)
Albumin: 4.2 g/dL (ref 3.8–4.9)
Alkaline Phosphatase: 92 IU/L (ref 44–121)
BUN/Creatinine Ratio: 25 — ABNORMAL HIGH (ref 9–23)
BUN: 17 mg/dL (ref 6–24)
Bilirubin Total: 0.2 mg/dL (ref 0.0–1.2)
CO2: 26 mmol/L (ref 20–29)
Calcium: 9.4 mg/dL (ref 8.7–10.2)
Chloride: 98 mmol/L (ref 96–106)
Creatinine, Ser: 0.68 mg/dL (ref 0.57–1.00)
GFR calc Af Amer: 112 mL/min/{1.73_m2} (ref >59)
GFR calc non Af Amer: 97 mL/min/{1.73_m2} (ref >59)
Globulin, Total: 2.9 g/dL (ref 1.5–4.5)
Glucose: 91 mg/dL (ref 65–99)
Potassium: 4.2 mmol/L (ref 3.5–5.2)
Sodium: 137 mmol/L (ref 134–144)
Total Protein: 7.1 g/dL (ref 6.0–8.5)

## 2020-02-10 LAB — CBC
Hematocrit: 37.9 % (ref 34.0–46.6)
Hemoglobin: 13.3 g/dL (ref 11.1–15.9)
MCH: 32.4 pg (ref 26.6–33.0)
MCHC: 35.1 g/dL (ref 31.5–35.7)
MCV: 92 fL (ref 79–97)
Platelets: 358 10*3/uL (ref 150–450)
RBC: 4.1 x10E6/uL (ref 3.77–5.28)
RDW: 13.1 % (ref 11.7–15.4)
WBC: 7.4 10*3/uL (ref 3.4–10.8)

## 2020-02-10 LAB — VITAMIN D 25 HYDROXY (VIT D DEFICIENCY, FRACTURES): Vit D, 25-Hydroxy: 21.6 ng/mL — ABNORMAL LOW (ref 30.0–100.0)

## 2020-02-10 LAB — LIPID PANEL
Chol/HDL Ratio: 4.6 ratio — ABNORMAL HIGH (ref 0.0–4.4)
Cholesterol, Total: 266 mg/dL — ABNORMAL HIGH (ref 100–199)
HDL: 58 mg/dL (ref >39)
LDL Chol Calc (NIH): 157 mg/dL — ABNORMAL HIGH (ref 0–99)
Triglycerides: 277 mg/dL — ABNORMAL HIGH (ref 0–149)
VLDL Cholesterol Cal: 51 mg/dL — ABNORMAL HIGH (ref 5–40)

## 2020-03-21 ENCOUNTER — Encounter: Payer: Self-pay | Admitting: Internal Medicine

## 2020-03-21 ENCOUNTER — Other Ambulatory Visit: Payer: Self-pay | Admitting: Internal Medicine

## 2020-04-17 ENCOUNTER — Encounter: Payer: Self-pay | Admitting: Obstetrics and Gynecology

## 2020-04-19 ENCOUNTER — Other Ambulatory Visit: Payer: Self-pay | Admitting: Obstetrics and Gynecology

## 2020-04-19 MED ORDER — ESTRADIOL 0.5 MG PO TABS: 0.5000 mg | ORAL_TABLET | Freq: Every day | ORAL | 3 refills | Status: DC

## 2020-06-12 ENCOUNTER — Encounter: Payer: Self-pay | Admitting: Internal Medicine

## 2020-06-20 DIAGNOSIS — K5730 Diverticulosis of large intestine without perforation or abscess without bleeding: Secondary | ICD-10-CM | POA: Diagnosis not present

## 2020-06-20 DIAGNOSIS — K219 Gastro-esophageal reflux disease without esophagitis: Secondary | ICD-10-CM | POA: Diagnosis not present

## 2020-06-20 DIAGNOSIS — Z1211 Encounter for screening for malignant neoplasm of colon: Secondary | ICD-10-CM | POA: Diagnosis not present

## 2020-06-20 DIAGNOSIS — Z86010 Personal history of colonic polyps: Secondary | ICD-10-CM | POA: Diagnosis not present

## 2020-06-21 ENCOUNTER — Other Ambulatory Visit: Payer: Self-pay | Admitting: Internal Medicine

## 2020-06-23 ENCOUNTER — Encounter: Payer: Self-pay | Admitting: Internal Medicine

## 2020-06-27 NOTE — Telephone Encounter (Signed)
    Patient wants to know since she has not had Lexapro in several days how should resume taking medication? She is unsure if she needs to just start taking again or adjust dosage

## 2020-06-30 ENCOUNTER — Ambulatory Visit (INDEPENDENT_AMBULATORY_CARE_PROVIDER_SITE_OTHER): Payer: Federal, State, Local not specified - PPO | Admitting: Internal Medicine

## 2020-06-30 ENCOUNTER — Encounter: Payer: Self-pay | Admitting: Internal Medicine

## 2020-06-30 ENCOUNTER — Other Ambulatory Visit: Payer: Self-pay

## 2020-06-30 VITALS — BP 122/80 | HR 55 | Temp 98.2°F | Resp 18 | Ht 65.0 in | Wt 156.4 lb

## 2020-06-30 DIAGNOSIS — Z0000 Encounter for general adult medical examination without abnormal findings: Secondary | ICD-10-CM

## 2020-06-30 DIAGNOSIS — E782 Mixed hyperlipidemia: Secondary | ICD-10-CM

## 2020-06-30 DIAGNOSIS — F325 Major depressive disorder, single episode, in full remission: Secondary | ICD-10-CM

## 2020-06-30 LAB — LIPID PANEL
Cholesterol: 235 mg/dL — ABNORMAL HIGH (ref 0–200)
HDL: 54.8 mg/dL (ref >39.00)
LDL Cholesterol: 144 mg/dL — ABNORMAL HIGH (ref 0–99)
NonHDL: 180.05
Total CHOL/HDL Ratio: 4
Triglycerides: 182 mg/dL — ABNORMAL HIGH (ref 0.0–149.0)
VLDL: 36.4 mg/dL (ref 0.0–40.0)

## 2020-06-30 MED ORDER — TRIAMCINOLONE ACETONIDE 0.1 % EX CREA: 1.0000 "application " | TOPICAL_CREAM | Freq: Two times a day (BID) | CUTANEOUS | 0 refills | Status: DC

## 2020-06-30 MED ORDER — ESCITALOPRAM OXALATE 5 MG PO TABS: 5.0000 mg | ORAL_TABLET | Freq: Every day | ORAL | 1 refills | Status: DC

## 2020-06-30 NOTE — Assessment & Plan Note (Signed)
Will attempt wean lexapro, decrease to 5 mg daily for 2-3 weeks, then 2.5 mg daily for 2-3 weeks then stop. Rx lexapro 5 mg today to aid in wean.

## 2020-06-30 NOTE — Assessment & Plan Note (Signed)
Flu shot counseled. Covid-19 up to date 3 shots. Shingrix complete. Tetanus up to date. Colonoscopy due later this year already scheduled. Mammogram up to date, pap smear up to date. Counseled about sun safety and mole surveillance. Counseled about the dangers of distracted driving. Given 10 year screening recommendations.

## 2020-06-30 NOTE — Assessment & Plan Note (Signed)
Has stopped eating meat and wants to recheck lipid panel. Given family history of CAD may need aggressive lipid therapy.

## 2020-06-30 NOTE — Patient Instructions (Addendum)
We will have you do 5 mg daily (1/2 of 10 mg pill) lexapro for 2-3 weeks. Then go down to 2.5 mg daily (1/2 of 5 mg pill) for 2-3 weeks. Then stop.  Food Choices for Gastroesophageal Reflux Disease, Adult When you have gastroesophageal reflux disease (GERD), the foods you eat and your eating habits are very important. Choosing the right foods can help ease your discomfort. Think about working with a food expert (dietitian) to help you make good choices. What are tips for following this plan? Reading food labels  Look for foods that are low in saturated fat. Foods that may help with your symptoms include: ? Foods that have less than 5% of daily value (DV) of fat. ? Foods that have 0 grams of trans fat. Cooking  Do not fry your food.  Cook your food by baking, steaming, grilling, or broiling. These are all methods that do not need a lot of fat for cooking.  To add flavor, try to use herbs that are low in spice and acidity. Meal planning  Choose healthy foods that are low in fat, such as: ? Fruits and vegetables. ? Whole grains. ? Low-fat dairy products. ? Lean meats, fish, and poultry.  Eat small meals often instead of eating 3 large meals each day. Eat your meals slowly in a place where you are relaxed. Avoid bending over or lying down until 2-3 hours after eating.  Limit high-fat foods such as fatty meats or fried foods.  Limit your intake of fatty foods, such as oils, butter, and shortening.  Avoid the following as told by your doctor: ? Foods that cause symptoms. These may be different for different people. Keep a food diary to keep track of foods that cause symptoms. ? Alcohol. ? Drinking a lot of liquid with meals. ? Eating meals during the 2-3 hours before bed.   Lifestyle  Stay at a healthy weight. Ask your doctor what weight is healthy for you. If you need to lose weight, work with your doctor to do so safely.  Exercise for at least 30 minutes on 5 or more days each  week, or as told by your doctor.  Wear loose-fitting clothes.  Do not smoke or use any products that contain nicotine or tobacco. If you need help quitting, ask your doctor.  Sleep with the head of your bed higher than your feet. Use a wedge under the mattress or blocks under the bed frame to raise the head of the bed.  Chew sugar-free gum after meals. What foods should eat? Eat a healthy, well-balanced diet of fruits, vegetables, whole grains, low-fat dairy products, lean meats, fish, and poultry. Each person is different. Foods that may cause symptoms in one person may not cause any symptoms in another person. Work with your doctor to find foods that are safe for you. The items listed above may not be a complete list of what you can eat and drink. Contact a food expert for more options.   What foods should I avoid? Limiting some of these foods may help in managing the symptoms of GERD. Everyone is different. Talk with a food expert or your doctor to help you find the exact foods to avoid, if any. Fruits Any fruits prepared with added fat. Any fruits that cause symptoms. For some people, this may include citrus fruits, such as oranges, grapefruit, pineapple, and lemons. Vegetables Deep-fried vegetables. [place] fries. Any vegetables prepared with added fat. Any vegetables that cause symptoms. For some  people, this may include tomatoes and tomato products, chili peppers, onions and garlic, and horseradish. Grains Pastries or quick breads with added fat. Meats and other proteins High-fat meats, such as fatty beef or pork, hot dogs, ribs, ham, sausage, salami, and bacon. Fried meat or protein, including fried fish and fried chicken. Nuts and nut butters, in large amounts. Dairy Whole milk and chocolate milk. Sour cream. Cream. Ice cream. Cream cheese. Milkshakes. Fats and oils Butter. Margarine. Shortening. Ghee. Beverages Coffee and tea, with or without caffeine. Carbonated beverages.  Sodas. Energy drinks. Fruit juice made with acidic fruits, such as orange or grapefruit. Tomato juice. Alcoholic drinks. Sweets and desserts Chocolate and cocoa. Donuts. Seasonings and condiments Pepper. Peppermint and spearmint. Added salt. Any condiments, herbs, or seasonings that cause symptoms. For some people, this may include curry, hot sauce, or vinegar-based salad dressings. The items listed above may not be a complete list of what you should not eat and drink. Contact a food expert for more options. Questions to ask your doctor Diet and lifestyle changes are often the first steps that are taken to manage symptoms of GERD. If diet and lifestyle changes do not help, talk with your doctor about taking medicines. Where to find more information  International Foundation for Gastrointestinal Disorders: aboutgerd.org Summary  When you have GERD, food and lifestyle choices are very important in easing your symptoms.  Eat small meals often instead of 3 large meals a day. Eat your meals slowly and in a place where you are relaxed.  Avoid bending over or lying down until 2-3 hours after eating.  Limit high-fat foods such as fatty meats or fried foods. This information is not intended to replace advice given to you by your health care provider. Make sure you discuss any questions you have with your health care provider. Document Revised: 09/06/2019 Document Reviewed: 09/06/2019 Elsevier Patient Education  2021 Elsevier Inc.   Health Maintenance, Female Adopting a healthy lifestyle and getting preventive care are important in promoting health and wellness. Ask your health care provider about:  The right schedule for you to have regular tests and exams.  Things you can do on your own to prevent diseases and keep yourself healthy. What should I know about diet, weight, and exercise? Eat a healthy diet  Eat a diet that includes plenty of vegetables, fruits, low-fat dairy products, and  lean protein.  Do not eat a lot of foods that are high in solid fats, added sugars, or sodium.   Maintain a healthy weight Body mass index (BMI) is used to identify weight problems. It estimates body fat based on height and weight. Your health care provider can help determine your BMI and help you achieve or maintain a healthy weight. Get regular exercise Get regular exercise. This is one of the most important things you can do for your health. Most adults should:  Exercise for at least 150 minutes each week. The exercise should increase your heart rate and make you sweat (moderate-intensity exercise).  Do strengthening exercises at least twice a week. This is in addition to the moderate-intensity exercise.  Spend less time sitting. Even light physical activity can be beneficial. Watch cholesterol and blood lipids Have your blood tested for lipids and cholesterol at 59 years of age, then have this test every 5 years. Have your cholesterol levels checked more often if:  Your lipid or cholesterol levels are high.  You are older than 59 years of age.  You are at high  risk for heart disease. What should I know about cancer screening? Depending on your health history and family history, you may need to have cancer screening at various ages. This may include screening for:  Breast cancer.  Cervical cancer.  Colorectal cancer.  Skin cancer.  Lung cancer. What should I know about heart disease, diabetes, and high blood pressure? Blood pressure and heart disease  High blood pressure causes heart disease and increases the risk of stroke. This is more likely to develop in people who have high blood pressure readings, are of African descent, or are overweight.  Have your blood pressure checked: ? Every 3-5 years if you are 76-69 years of age. ? Every year if you are 93 years old or older. Diabetes Have regular diabetes screenings. This checks your fasting blood sugar level. Have the  screening done:  Once every three years after age 87 if you are at a normal weight and have a low risk for diabetes.  More often and at a younger age if you are overweight or have a high risk for diabetes. What should I know about preventing infection? Hepatitis B If you have a higher risk for hepatitis B, you should be screened for this virus. Talk with your health care provider to find out if you are at risk for hepatitis B infection. Hepatitis C Testing is recommended for:  Everyone born from [birth year] through 1964.  Anyone with known risk factors for hepatitis C. Sexually transmitted infections (STIs)  Get screened for STIs, including gonorrhea and chlamydia, if: ? You are sexually active and are younger than 59 years of age. ? You are older than 59 years of age and your health care provider tells you that you are at risk for this type of infection. ? Your sexual activity has changed since you were last screened, and you are at increased risk for chlamydia or gonorrhea. Ask your health care provider if you are at risk.  Ask your health care provider about whether you are at high risk for HIV. Your health care provider may recommend a prescription medicine to help prevent HIV infection. If you choose to take medicine to prevent HIV, you should first get tested for HIV. You should then be tested every 3 months for as long as you are taking the medicine. Pregnancy  If you are about to stop having your period (premenopausal) and you may become pregnant, seek counseling before you get pregnant.  Take 400 to 800 micrograms (mcg) of folic acid every day if you become pregnant.  Ask for birth control (contraception) if you want to prevent pregnancy. Osteoporosis and menopause Osteoporosis is a disease in which the bones lose minerals and strength with aging. This can result in bone fractures. If you are 32 years old or older, or if you are at risk for osteoporosis and fractures, ask your health  care provider if you should:  Be screened for bone loss.  Take a calcium or vitamin D supplement to lower your risk of fractures.  Be given hormone replacement therapy (HRT) to treat symptoms of menopause. Follow these instructions at home: Lifestyle  Do not use any products that contain nicotine or tobacco, such as cigarettes, e-cigarettes, and chewing tobacco. If you need help quitting, ask your health care provider.  Do not use street drugs.  Do not share needles.  Ask your health care provider for help if you need support or information about quitting drugs. Alcohol use  Do not drink alcohol if: ?  Your health care provider tells you not to drink. ? You are pregnant, may be pregnant, or are planning to become pregnant.  If you drink alcohol: ? Limit how much you use to 0-1 drink a day. ? Limit intake if you are breastfeeding.  Be aware of how much alcohol is in your drink. In the [place] one drink equals one 12 oz bottle of beer (355 mL), one 5 oz glass of wine (148 mL), or one 1 oz glass of hard liquor (44 mL). General instructions  Schedule regular health, dental, and eye exams.  Stay current with your vaccines.  Tell your health care provider if: ? You often feel depressed. ? You have ever been abused or do not feel safe at home. Summary  Adopting a healthy lifestyle and getting preventive care are important in promoting health and wellness.  Follow your health care provider's instructions about healthy diet, exercising, and getting tested or screened for diseases.  Follow your health care provider's instructions on monitoring your cholesterol and blood pressure. This information is not intended to replace advice given to you by your health care provider. Make sure you discuss any questions you have with your health care provider. Document Revised: 02/18/2018 Document Reviewed: 02/18/2018 Elsevier Patient Education  2021 [employer]

## 2020-06-30 NOTE — Progress Notes (Signed)
   Subjective:   Patient ID: April Vaughn, female    DOB: October 07, 1960, 60 y.o.   MRN: [id number]  HPI The patient is a 60 YO female coming in for physical.   PMH, FMH, social history reviewed and updated  Review of Systems  Constitutional: Negative.   HENT: Negative.   Eyes: Negative.   Respiratory: Negative for cough, chest tightness and shortness of breath.   Cardiovascular: Negative for chest pain, palpitations and leg swelling.  Gastrointestinal: Negative for abdominal distention, abdominal pain, constipation, diarrhea, nausea and vomiting.  Musculoskeletal: Negative.   Skin: Negative.   Neurological: Negative.   Psychiatric/Behavioral: Negative.     Objective:  Physical Exam Constitutional:      Appearance: She is well-developed.  HENT:     Head: Normocephalic and atraumatic.  Cardiovascular:     Rate and Rhythm: Normal rate and regular rhythm.  Pulmonary:     Effort: Pulmonary effort is normal. No respiratory distress.     Breath sounds: Normal breath sounds. No wheezing or rales.  Abdominal:     General: Bowel sounds are normal. There is no distension.     Palpations: Abdomen is soft.     Tenderness: There is no abdominal tenderness. There is no rebound.  Musculoskeletal:     Cervical back: Normal range of motion.  Skin:    General: Skin is warm and dry.  Neurological:     Mental Status: She is alert and oriented to person, place, and time.     Coordination: Coordination normal.     Vitals:   06/30/20 0854  BP: 122/80  Pulse: (!) 55  Resp: 18  Temp: 98.2 F (36.8 C)  TempSrc: Oral  SpO2: 98%  Weight: 156 lb 6.4 oz (70.9 kg)  Height: 5\' 5"  (1.651 m)    This visit occurred during the SARS-CoV-2 public health emergency.  Safety protocols were in place, including screening questions prior to the visit, additional usage of staff PPE, and extensive cleaning of exam room while observing appropriate contact time as indicated for disinfecting solutions.    Assessment & Plan:

## 2020-07-13 ENCOUNTER — Encounter: Payer: Self-pay | Admitting: Internal Medicine

## 2020-07-24 DIAGNOSIS — Z20822 Contact with and (suspected) exposure to covid-19: Secondary | ICD-10-CM | POA: Diagnosis not present

## 2020-07-24 DIAGNOSIS — Z711 Person with feared health complaint in whom no diagnosis is made: Secondary | ICD-10-CM | POA: Diagnosis not present

## 2020-07-24 DIAGNOSIS — Z03818 Encounter for observation for suspected exposure to other biological agents ruled out: Secondary | ICD-10-CM | POA: Diagnosis not present

## 2020-08-21 DIAGNOSIS — K5730 Diverticulosis of large intestine without perforation or abscess without bleeding: Secondary | ICD-10-CM | POA: Diagnosis not present

## 2020-08-21 DIAGNOSIS — Z1211 Encounter for screening for malignant neoplasm of colon: Secondary | ICD-10-CM | POA: Diagnosis not present

## 2020-08-21 DIAGNOSIS — K635 Polyp of colon: Secondary | ICD-10-CM | POA: Diagnosis not present

## 2020-08-21 DIAGNOSIS — D120 Benign neoplasm of cecum: Secondary | ICD-10-CM | POA: Diagnosis not present

## 2020-11-14 ENCOUNTER — Encounter: Payer: Self-pay | Admitting: Internal Medicine

## 2020-11-15 ENCOUNTER — Other Ambulatory Visit: Payer: Self-pay

## 2020-11-15 MED ORDER — ESCITALOPRAM OXALATE 10 MG PO TABS: 10.0000 mg | ORAL_TABLET | Freq: Every day | ORAL | 1 refills | Status: DC

## 2020-12-03 IMAGING — MG DIGITAL SCREENING BILATERAL MAMMOGRAM WITH TOMO AND CAD
8 series · 8 of 24 positions shown · non-contrast
Comparison: Previous exam(s).

CLINICAL DATA: Screening.

EXAM:
DIGITAL SCREENING BILATERAL MAMMOGRAM WITH TOMO AND CAD

[L MLO synth-2D]
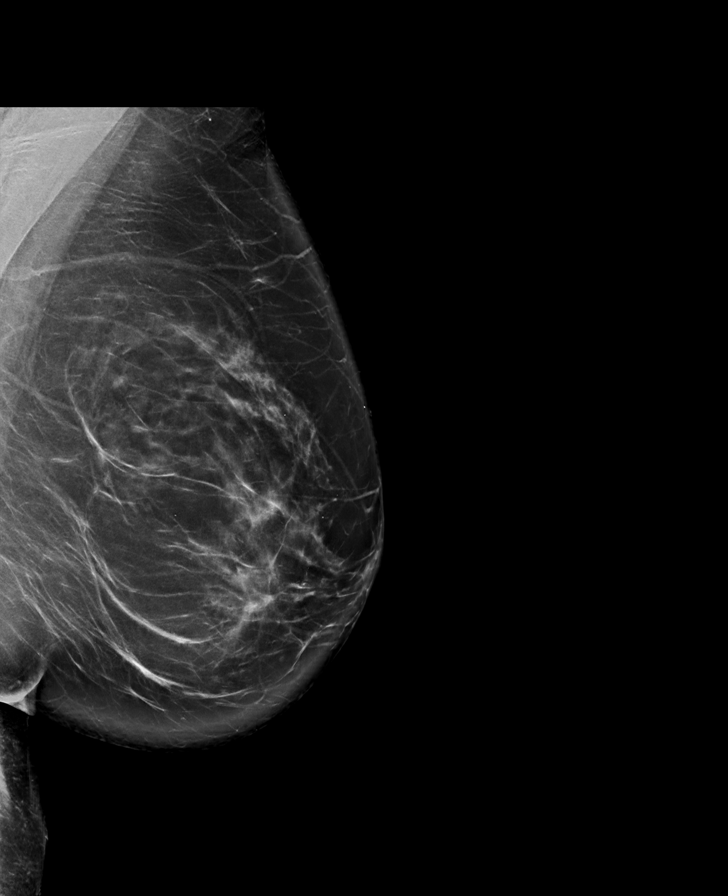

[R MLO synth-2D]
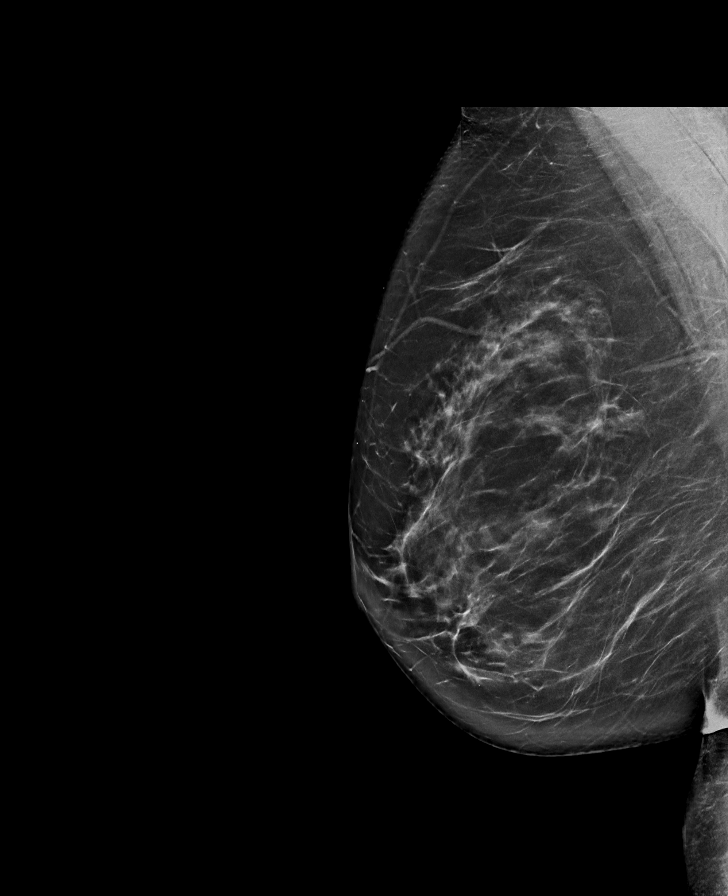

[L CC synth-2D]
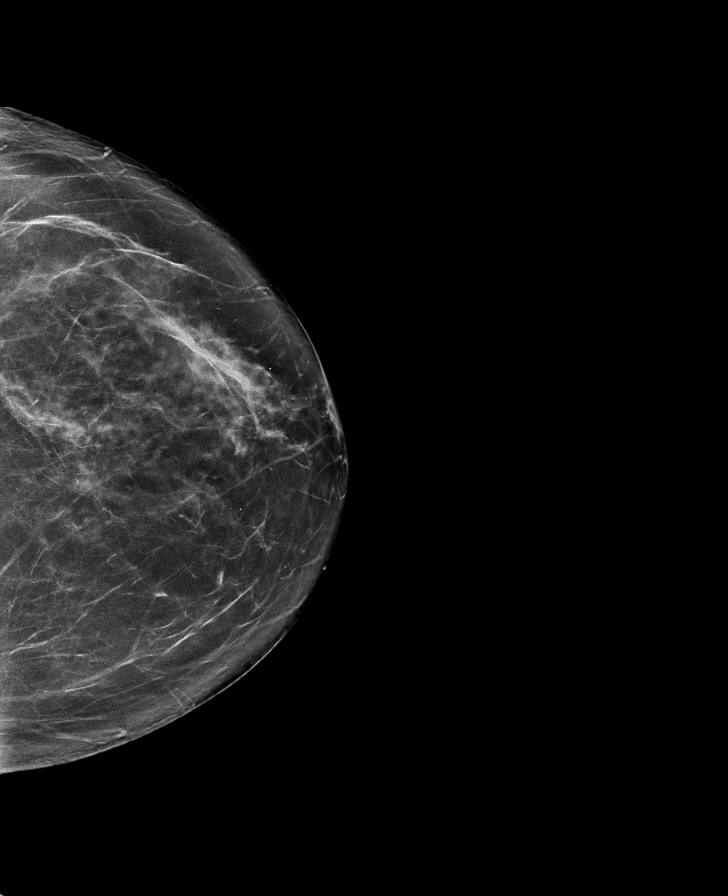

[R CC synth-2D]
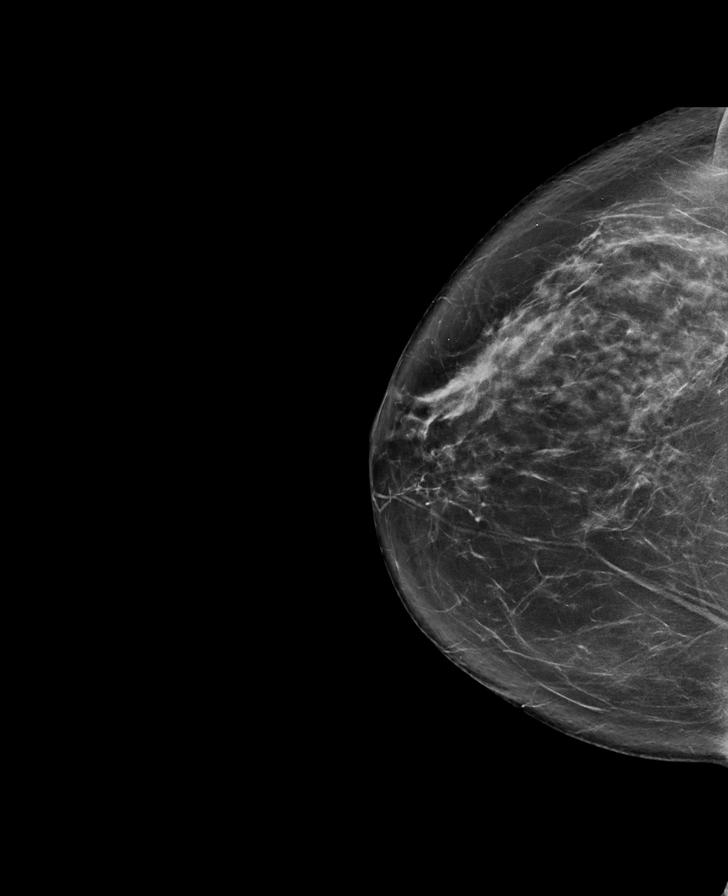

[R CC tomo · tomo slice 41/82.0]
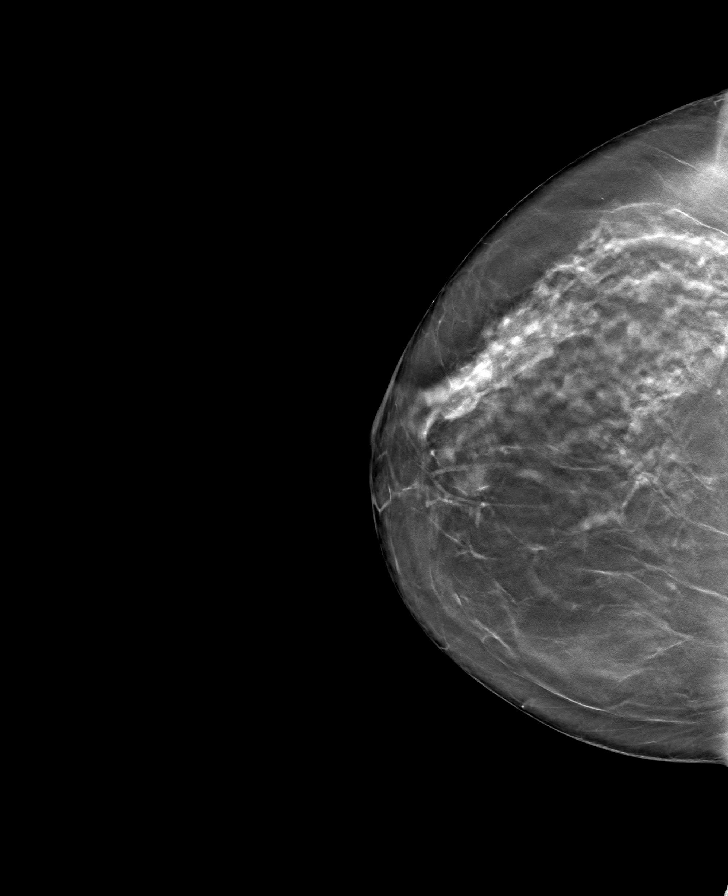

[R MLO tomo · tomo slice 43/86.0]
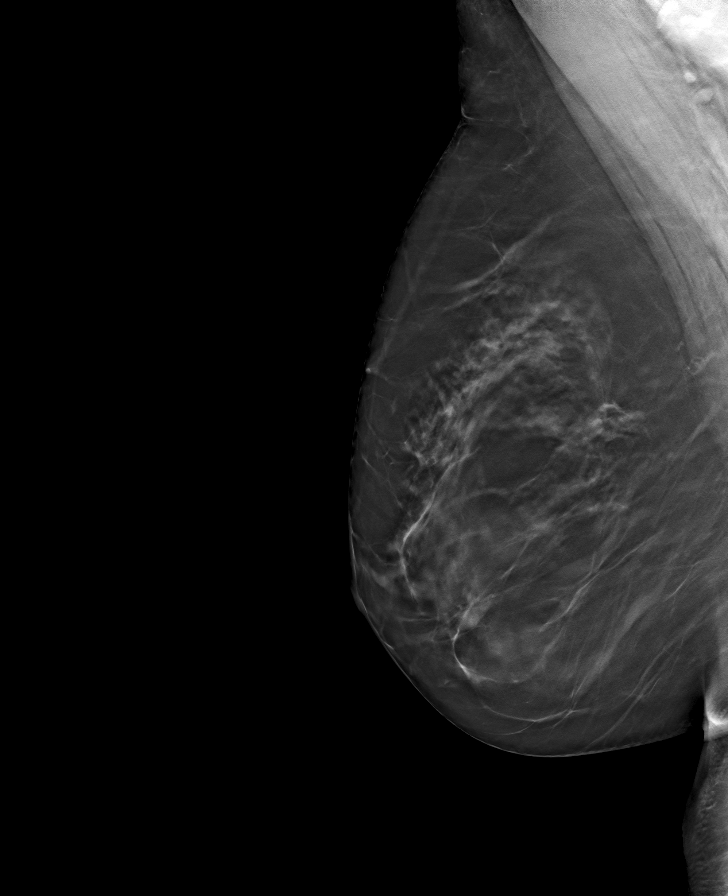

[L MLO tomo · tomo slice 49/96.0]
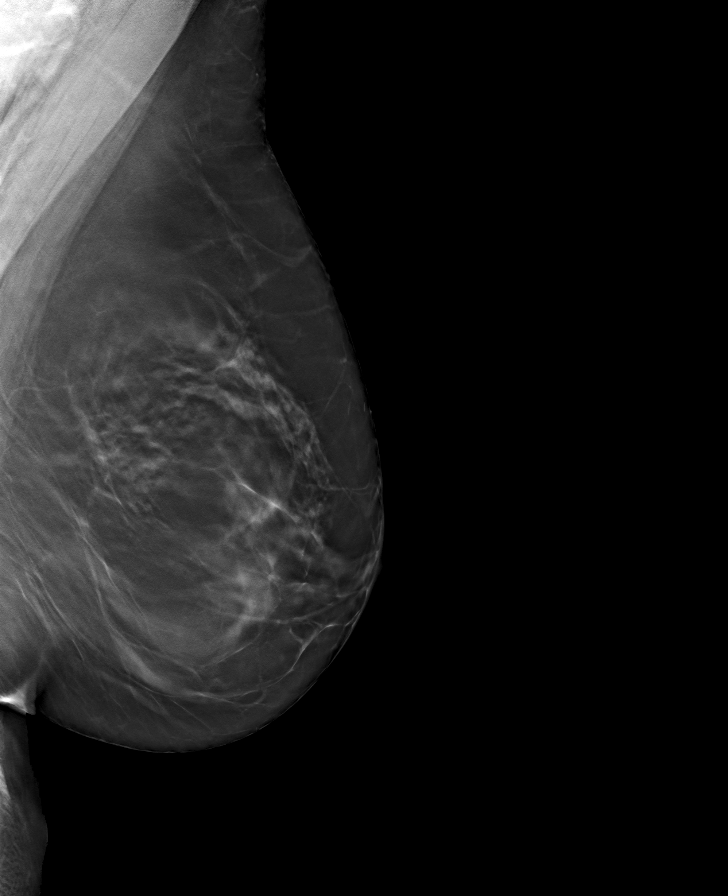

[L CC tomo · tomo slice 42/83.0]
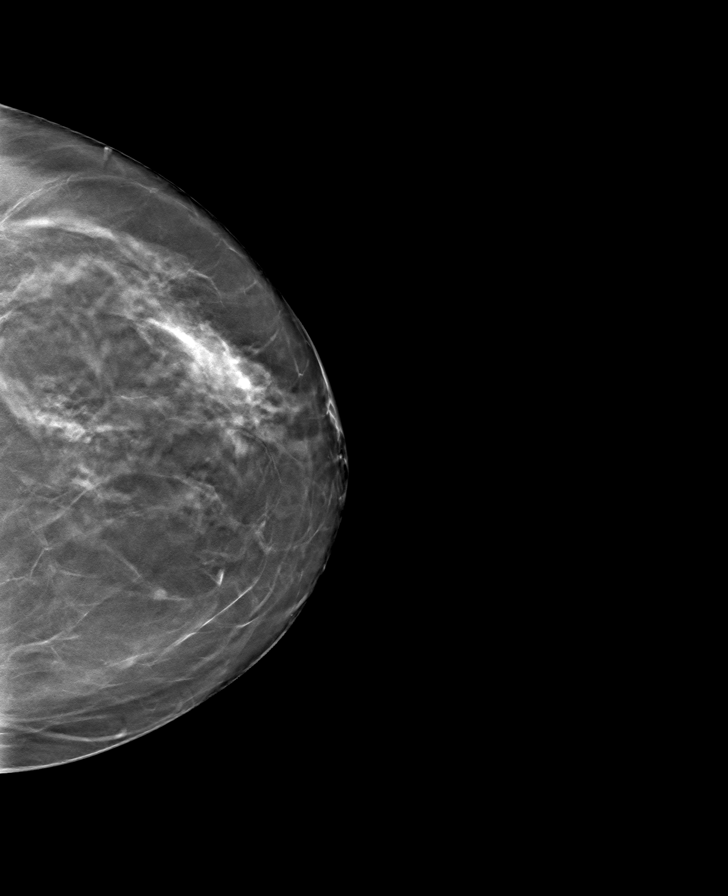

[8 of 24 positions shown; findings below may reference images not displayed]

ACR Breast Density Category b: There are scattered areas of
fibroglandular density.
FINDINGS: There are no findings suspicious for malignancy. Images were
processed with CAD.
IMPRESSION: No mammographic evidence of malignancy. A result letter of this
screening mammogram will be mailed directly to the patient.

RECOMMENDATION:
Screening mammogram in one year. (Code:[id number])

BI-RADS CATEGORY  1: Negative.

## 2020-12-15 ENCOUNTER — Other Ambulatory Visit: Payer: Self-pay | Admitting: Obstetrics and Gynecology

## 2020-12-15 DIAGNOSIS — A609 Anogenital herpesviral infection, unspecified: Secondary | ICD-10-CM

## 2020-12-15 NOTE — Telephone Encounter (Signed)
Annual exam scheduled on 02/14/21. 

## 2021-02-12 ENCOUNTER — Other Ambulatory Visit: Payer: Self-pay | Admitting: Obstetrics and Gynecology

## 2021-02-12 DIAGNOSIS — Z1231 Encounter for screening mammogram for malignant neoplasm of breast: Secondary | ICD-10-CM

## 2021-02-12 NOTE — Progress Notes (Signed)
60 y.o. G0P0000 Single White or Caucasian Not Hispanic or Latino female here for annual exam.  She is using the estradiol patch instead of the pill.  Doing well on the 0.025 mg estradiol patch and prometrium. No vaginal bleeding. No dyspareunia.   She c/o a 6 month h/o possible white discharge from the left breast. She has never seen d/c, but notices a white flaking in her bra. No staining of her bra. She doesn't notice anything when she squeezes her nipple. Her left nipple has been inverted for years (off and on).   H/O oral hsv    Patient's last menstrual period was 04/12/2011.          Sexually active: Yes.   Same sex partner.  The current method of family planning is post menopausal status.    Exercising: Yes.     Biking  Smoker:  no  Health Maintenance: Pap:  09/22/18  WNL, Hr HPV Neg; 09-10-16 neg  History of abnormal Pap:  no MMG:  02/01/20 density B Bi-rads 1 neg, scheduled tomorrow.  BMD:   none  Colonoscopy: 08/21/20 with Dr Loreta Ave, + polyp, f/u 5 years  TDaP:  2013  Gardasil: n/a   reports that she has never smoked. She has never used smokeless tobacco. She reports current alcohol use of about 4.0 standard drinks per week. She reports that she does not use drugs. 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 Lives with her partner. Her partner became a Surveyor, minerals today (little boy, local).  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 20 tablet 0   benzonatate (TESSALON) 200 MG capsule Take 1 capsule (200 mg total) by mouth 3 (three)  times daily as needed. 60 capsule 0   escitalopram (LEXAPRO) 10 MG tablet Take 1 tablet (10 mg total) by mouth daily. 90 tablet 1   Fexofenadine HCl (ALLEGRA ALLERGY PO) Take by mouth daily.     IBU 600 MG tablet      progesterone (PROMETRIUM) 100 MG capsule Take 1 capsule (100 mg total) by mouth daily. 90 capsule 3   triamcinolone cream (KENALOG) 0.1 % Apply 1 application topically 2 (two) times daily. 30 g 0   valACYclovir (VALTREX) 1000 MG tablet TAKE 2 TABLETS TWICE A DAY q12 hours x 2 prn cold sore. 30 tablet 1   No current facility-administered medications for this visit.    Family History  Problem Relation Age of Onset   Coronary artery disease Mother 74       CABG, died 47 due to MI   Aneurysm Mother        behind eye   Hypertension Father    Coronary artery disease Father        CABG   Kidney failure Father    Coronary artery disease Sister 79       MI, 9 stents   Hyperlipidemia Sister    Crohn's disease Sister 52   Polymyalgia rheumatica Sister  PMR   Colon cancer Maternal Grandfather 13    Review of Systems  All other systems reviewed and are negative.  Exam:   BP 104/62   Pulse 81   Ht 5\' 6"  (1.676 m)   Wt 156 lb (70.8 kg)   LMP 04/12/2011   SpO2 99%   BMI 25.18 kg/m   Weight change: @WEIGHTCHANGE @ Height:   Height: 5\' 6"  (167.6 cm)  Ht Readings from Last 3 Encounters:  02/14/21 5\' 6"  (1.676 m)  02/13/21 5\' 5"  (1.651 m)  06/30/20 5\' 5"  (1.651 m)    General appearance: alert, cooperative and appears stated age Head: Normocephalic, without obvious abnormality, atraumatic Neck: no adenopathy, supple, symmetrical, trachea midline and thyroid normal to inspection and palpation Lungs: clear to auscultation bilaterally Cardiovascular: regular rate and rhythm Breasts: normal appearance, no masses or tenderness, no nipple d/c, unable to express discharge.  Not inverted.  Abdomen: soft, non-tender; non distended,  no masses,  no organomegaly Extremities:  extremities normal, atraumatic, no cyanosis or edema Skin: Skin color, texture, turgor normal. No rashes or lesions Lymph nodes: Cervical, supraclavicular, and axillary nodes normal. No abnormal inguinal nodes palpated Neurologic: Grossly normal   Pelvic: External genitalia:  no lesions              Urethra:  normal appearing urethra with no masses, tenderness or lesions              Bartholins and Skenes: normal                 Vagina: normal appearing vagina with normal color and discharge, no lesions              Cervix: no lesions               Bimanual Exam:  Uterus:  normal size, contour, position, consistency, mobility, non-tender              Adnexa: no mass, fullness, tenderness               Rectovaginal: Confirms               Anus:  normal sphincter tone, no lesions  Gae Dry chaperoned for the exam.   1. Well woman exam -She has noticed white flaking in her bra, no staining of her bra, has never noticed discharge, no staining of her bra. Normal exam. She will try putting a tissue in her bra to try to determine any leakage. -She has a mammogram tomorrow -Colonoscopy UTD -no pap this year -Recommended she get lab work with her primary  2. Hormone replacement therapy (HRT) Doing well, wants to continue. - estradiol (VIVELLE-DOT) 0.025 MG/24HR; Place 1 patch onto the skin 2 (two) times a week.  Dispense: 24 patch; Refill: 3 - progesterone (PROMETRIUM) 100 MG capsule; Take 1 capsule (100 mg total) by mouth daily.  Dispense: 90 capsule; Refill: 3

## 2021-02-13 ENCOUNTER — Ambulatory Visit: Payer: Federal, State, Local not specified - PPO | Admitting: Internal Medicine

## 2021-02-13 ENCOUNTER — Encounter: Payer: Self-pay | Admitting: Internal Medicine

## 2021-02-13 ENCOUNTER — Other Ambulatory Visit: Payer: Self-pay

## 2021-02-13 DIAGNOSIS — J0110 Acute frontal sinusitis, unspecified: Secondary | ICD-10-CM | POA: Diagnosis not present

## 2021-02-13 MED ORDER — AMOXICILLIN-POT CLAVULANATE 875-125 MG PO TABS: 1.0000 | ORAL_TABLET | Freq: Two times a day (BID) | ORAL | 0 refills | Status: DC

## 2021-02-13 MED ORDER — BENZONATATE 200 MG PO CAPS: 200.0000 mg | ORAL_CAPSULE | Freq: Three times a day (TID) | ORAL | 0 refills | Status: DC | PRN

## 2021-02-13 NOTE — Patient Instructions (Addendum)
We have sent in augmentin for a sinus infection. Take 1 pill twice a day for 10 days to clear this.  We have sent in the tessalon perles to take up to 3 times a day for cough.

## 2021-02-13 NOTE — Assessment & Plan Note (Signed)
Rx augmentin for 3-4 weeks of sinus symptoms. Continue allegra and rx tessalon perles.

## 2021-02-13 NOTE — Progress Notes (Signed)
   Subjective:   Patient ID: April Vaughn, female    DOB: Mar 21, 1960, 60 y.o.   MRN: [id number]  HPI The patient is a 60 YO female coming in for 1 month of sinus symptoms.   Review of Systems  Constitutional:  Positive for activity change, appetite change and fatigue. Negative for chills, fever and unexpected weight change.  HENT:  Positive for congestion, postnasal drip, rhinorrhea and sinus pressure. Negative for ear discharge, ear pain, sinus pain, sneezing, sore throat, tinnitus, trouble swallowing and voice change.   Eyes: Negative.   Respiratory:  Positive for cough. Negative for chest tightness, shortness of breath and wheezing.   Cardiovascular: Negative.   Gastrointestinal: Negative.   Musculoskeletal:  Negative for myalgias.  Neurological:  Positive for headaches.   Objective:  Physical Exam Constitutional:      Appearance: She is well-developed.  HENT:     Head: Normocephalic and atraumatic.     Comments: Oropharynx with redness and clear drainage, nose with swollen turbinates, TMs normal bilaterally.  Neck:     Thyroid: No thyromegaly.  Cardiovascular:     Rate and Rhythm: Normal rate and regular rhythm.  Pulmonary:     Effort: Pulmonary effort is normal. No respiratory distress.     Breath sounds: Normal breath sounds. No wheezing or rales.  Abdominal:     General: Bowel sounds are normal. There is no distension.     Palpations: Abdomen is soft.     Tenderness: There is no abdominal tenderness. There is no rebound.  Musculoskeletal:        General: No tenderness.     Cervical back: Normal range of motion.  Lymphadenopathy:     Cervical: No cervical adenopathy.  Skin:    General: Skin is warm and dry.  Neurological:     Mental Status: She is alert and oriented to person, place, and time.     Coordination: Coordination normal.    Vitals:   02/13/21 1106  BP: 104/72  Pulse: 71  Resp: 18  Temp: 98.6 F (37 C)  TempSrc: Oral  SpO2: 97%  Weight: 158 lb  6.4 oz (71.8 kg)  Height: 5\' 5"  (1.651 m)    This visit occurred during the SARS-CoV-2 public health emergency.  Safety protocols were in place, including screening questions prior to the visit, additional usage of staff PPE, and extensive cleaning of exam room while observing appropriate contact time as indicated for disinfecting solutions.   Assessment & Plan:

## 2021-02-14 ENCOUNTER — Other Ambulatory Visit: Payer: Self-pay

## 2021-02-14 ENCOUNTER — Encounter: Payer: Self-pay | Admitting: Obstetrics and Gynecology

## 2021-02-14 ENCOUNTER — Ambulatory Visit (INDEPENDENT_AMBULATORY_CARE_PROVIDER_SITE_OTHER): Payer: Federal, State, Local not specified - PPO | Admitting: Obstetrics and Gynecology

## 2021-02-14 VITALS — BP 104/62 | HR 81 | Ht 66.0 in | Wt 156.0 lb

## 2021-02-14 DIAGNOSIS — Z79890 Hormone replacement therapy (postmenopausal): Secondary | ICD-10-CM

## 2021-02-14 DIAGNOSIS — Z01419 Encounter for gynecological examination (general) (routine) without abnormal findings: Secondary | ICD-10-CM | POA: Diagnosis not present

## 2021-02-14 MED ORDER — PROGESTERONE MICRONIZED 100 MG PO CAPS: 100.0000 mg | ORAL_CAPSULE | Freq: Every day | ORAL | 3 refills | Status: DC

## 2021-02-14 MED ORDER — ESTRADIOL 0.025 MG/24HR TD PTTW: 1.0000 | MEDICATED_PATCH | TRANSDERMAL | 3 refills | Status: DC

## 2021-02-14 NOTE — Patient Instructions (Signed)

## 2021-02-15 ENCOUNTER — Ambulatory Visit
Admission: RE | Admit: 2021-02-15 | Discharge: 2021-02-15 | Disposition: A | Discharge status: Home / Self Care | Payer: Federal, State, Local not specified - PPO | Source: Ambulatory Visit | Attending: Obstetrics and Gynecology | Admitting: Obstetrics and Gynecology

## 2021-02-15 DIAGNOSIS — Z1231 Encounter for screening mammogram for malignant neoplasm of breast: Secondary | ICD-10-CM

## 2021-02-19 ENCOUNTER — Other Ambulatory Visit: Payer: Self-pay | Admitting: Obstetrics and Gynecology

## 2021-02-19 DIAGNOSIS — Z79890 Hormone replacement therapy (postmenopausal): Secondary | ICD-10-CM

## 2021-04-05 DIAGNOSIS — D3132 Benign neoplasm of left choroid: Secondary | ICD-10-CM | POA: Diagnosis not present

## 2021-05-27 ENCOUNTER — Other Ambulatory Visit: Payer: Self-pay | Admitting: Internal Medicine

## 2021-07-18 ENCOUNTER — Other Ambulatory Visit: Payer: Self-pay | Admitting: Internal Medicine

## 2021-08-10 ENCOUNTER — Other Ambulatory Visit: Payer: Self-pay | Admitting: Internal Medicine

## 2021-09-21 ENCOUNTER — Other Ambulatory Visit: Payer: Self-pay | Admitting: Internal Medicine

## 2021-09-24 ENCOUNTER — Encounter: Payer: Self-pay | Admitting: Internal Medicine

## 2021-09-24 ENCOUNTER — Other Ambulatory Visit: Payer: Self-pay

## 2021-09-24 MED ORDER — ESCITALOPRAM OXALATE 10 MG PO TABS: 10.0000 mg | ORAL_TABLET | Freq: Every day | ORAL | 0 refills | Status: DC

## 2021-11-01 ENCOUNTER — Other Ambulatory Visit: Payer: Self-pay | Admitting: Internal Medicine

## 2021-11-05 ENCOUNTER — Telehealth: Payer: Self-pay | Admitting: Internal Medicine

## 2021-11-05 NOTE — Telephone Encounter (Signed)
Patient called back about refill and states that she has an appointment scheduled for 11/13/2021 and that she still needs this RX sent to [facility].

## 2021-11-06 ENCOUNTER — Other Ambulatory Visit: Payer: Self-pay

## 2021-11-06 NOTE — Telephone Encounter (Signed)
Ok for 30 day fill

## 2021-11-13 ENCOUNTER — Ambulatory Visit (INDEPENDENT_AMBULATORY_CARE_PROVIDER_SITE_OTHER): Payer: Federal, State, Local not specified - PPO | Admitting: Internal Medicine

## 2021-11-13 ENCOUNTER — Encounter: Payer: Self-pay | Admitting: Internal Medicine

## 2021-11-13 VITALS — BP 118/78 | HR 66 | Temp 98.4°F | Ht 66.0 in | Wt 156.0 lb

## 2021-11-13 DIAGNOSIS — F325 Major depressive disorder, single episode, in full remission: Secondary | ICD-10-CM

## 2021-11-13 DIAGNOSIS — H6122 Impacted cerumen, left ear: Secondary | ICD-10-CM

## 2021-11-13 DIAGNOSIS — Z23 Encounter for immunization: Secondary | ICD-10-CM

## 2021-11-13 DIAGNOSIS — Z0000 Encounter for general adult medical examination without abnormal findings: Secondary | ICD-10-CM

## 2021-11-13 DIAGNOSIS — E782 Mixed hyperlipidemia: Secondary | ICD-10-CM

## 2021-11-13 MED ORDER — DICLOFENAC SODIUM 1 % EX GEL
2.0000 g | Freq: Four times a day (QID) | CUTANEOUS | 0 refills | Status: DC
Start: 2021-11-13 — End: 2023-03-17

## 2021-11-13 NOTE — Progress Notes (Signed)
   Subjective:   Patient ID: April Vaughn, female    DOB: [birth year], 61 y.o.   MRN: [id number]  HPI The patient is here for physical.  PMH, [facility], social history reviewed and updated  Review of Systems  Constitutional: Negative.   HENT: Negative.    Eyes: Negative.   Respiratory:  Negative for cough, chest tightness and shortness of breath.   Cardiovascular:  Negative for chest pain, palpitations and leg swelling.  Gastrointestinal:  Negative for abdominal distention, abdominal pain, constipation, diarrhea, nausea and vomiting.  Musculoskeletal: Negative.   Skin: Negative.   Neurological: Negative.   Psychiatric/Behavioral: Negative.      Objective:  Physical Exam Constitutional:      Appearance: She is well-developed.  HENT:     Head: Normocephalic and atraumatic.  Cardiovascular:     Rate and Rhythm: Normal rate and regular rhythm.  Pulmonary:     Effort: Pulmonary effort is normal. No respiratory distress.     Breath sounds: Normal breath sounds. No wheezing or rales.  Abdominal:     General: Bowel sounds are normal. There is no distension.     Palpations: Abdomen is soft.     Tenderness: There is no abdominal tenderness. There is no rebound.  Musculoskeletal:     Cervical back: Normal range of motion.  Skin:    General: Skin is warm and dry.  Neurological:     Mental Status: She is alert and oriented to person, place, and time.     Coordination: Coordination normal.    Vitals:   11/13/21 1537  BP: 118/78  Pulse: 66  Temp: 98.4 F (36.9 C)  TempSrc: Oral  SpO2: 94%  Weight: 156 lb (70.8 kg)  Height: 5\' 6"  (1.676 m)    Assessment & Plan:  Flu shot given at visit

## 2021-11-14 ENCOUNTER — Other Ambulatory Visit (INDEPENDENT_AMBULATORY_CARE_PROVIDER_SITE_OTHER): Payer: Federal, State, Local not specified - PPO

## 2021-11-14 DIAGNOSIS — Z0000 Encounter for general adult medical examination without abnormal findings: Secondary | ICD-10-CM | POA: Diagnosis not present

## 2021-11-14 LAB — COMPREHENSIVE METABOLIC PANEL
ALT: 16 U/L (ref 0–35)
AST: 17 U/L (ref 0–37)
Albumin: 3.9 g/dL (ref 3.5–5.2)
Alkaline Phosphatase: 64 U/L (ref 39–117)
BUN: 11 mg/dL (ref 6–23)
CO2: 25 mEq/L (ref 19–32)
Calcium: 9.1 mg/dL (ref 8.4–10.5)
Chloride: 104 mEq/L (ref 96–112)
Creatinine, Ser: 0.78 mg/dL (ref 0.40–1.20)
GFR: 82.88 mL/min (ref >60.00)
Glucose, Bld: 95 mg/dL (ref 70–99)
Potassium: 4 mEq/L (ref 3.5–5.1)
Sodium: 136 mEq/L (ref 135–145)
Total Bilirubin: 0.5 mg/dL (ref 0.2–1.2)
Total Protein: 7.3 g/dL (ref 6.0–8.3)

## 2021-11-14 LAB — CBC
HCT: 38.7 % (ref 36.0–46.0)
Hemoglobin: 13.3 g/dL (ref 12.0–15.0)
MCHC: 34.3 g/dL (ref 30.0–36.0)
MCV: 92.8 fl (ref 78.0–100.0)
Platelets: 318 10*3/uL (ref 150.0–400.0)
RBC: 4.17 Mil/uL (ref 3.87–5.11)
RDW: 13.4 % (ref 11.5–15.5)
WBC: 7.1 10*3/uL (ref 4.0–10.5)

## 2021-11-14 LAB — LIPID PANEL
Cholesterol: 230 mg/dL — ABNORMAL HIGH (ref 0–200)
HDL: 52.9 mg/dL (ref >39.00)
LDL Cholesterol: 147 mg/dL — ABNORMAL HIGH (ref 0–99)
NonHDL: 177.54
Total CHOL/HDL Ratio: 4
Triglycerides: 155 mg/dL — ABNORMAL HIGH (ref 0.0–149.0)
VLDL: 31 mg/dL (ref 0.0–40.0)

## 2021-11-14 NOTE — Assessment & Plan Note (Signed)
Refill lexapro 10 mg daily and well controlled. Does not want to stop at this time.

## 2021-11-14 NOTE — Assessment & Plan Note (Signed)
Flu shot given. Covid-19 counseled. Shingrix complete. Tetanus due declines today. Colonoscopy up to date. Mammogram up to date, pap smear up to date and dexa ordered. Counseled about sun safety and mole surveillance. Counseled about the dangers of distracted driving. Given 10 year screening recommendations.

## 2021-11-14 NOTE — Assessment & Plan Note (Signed)
Checking lipid panel and adjust as needed.  

## 2021-12-02 ENCOUNTER — Other Ambulatory Visit: Payer: Self-pay | Admitting: Internal Medicine

## 2021-12-03 ENCOUNTER — Other Ambulatory Visit: Payer: Self-pay | Admitting: Internal Medicine

## 2021-12-03 MED ORDER — ESCITALOPRAM OXALATE 10 MG PO TABS: 10.0000 mg | ORAL_TABLET | Freq: Every day | ORAL | 3 refills | Status: DC

## 2021-12-03 NOTE — Telephone Encounter (Signed)
Last refill 11/06/21 Please advise

## 2022-02-12 ENCOUNTER — Ambulatory Visit (INDEPENDENT_AMBULATORY_CARE_PROVIDER_SITE_OTHER)
Admission: RE | Admit: 2022-02-12 | Discharge: 2022-02-12 | Disposition: A | Discharge status: Home / Self Care | Payer: Federal, State, Local not specified - PPO | Source: Ambulatory Visit | Attending: Internal Medicine | Admitting: Internal Medicine

## 2022-02-12 DIAGNOSIS — Z79890 Hormone replacement therapy (postmenopausal): Secondary | ICD-10-CM | POA: Diagnosis not present

## 2022-02-12 DIAGNOSIS — Z0000 Encounter for general adult medical examination without abnormal findings: Secondary | ICD-10-CM

## 2022-02-12 NOTE — Progress Notes (Signed)
61 y.o. G0P0000 Single White or Caucasian Not Hispanic or Latino female here for annual exam.    She is on low dose HRT. Doing well, wants to continue, aware of the risks.  Sexually active, no discomfort.   H/O oral hsv  Patient's last menstrual period was 04/12/2011.          Sexually active: Yes.   Female partner The current method of family planning is post menopausal status.    Exercising: Yes.    walking Smoker:  no  Health Maintenance: Pap:   09/22/18  WNL, Hr HPV Neg; 09-10-16 neg   History of abnormal Pap:  no MMG:  02/16/21 density C Bi-rads 1 neg  BMD:   02/12/2022 T score -1.2, FRAX 4.1/0.3% Colonoscopy:08/21/20 with Dr Loreta Ave, + polyp, f/u 5 years  TDaP:  2013  Gardasil: n/a   reports that she has never smoked. She has never used smokeless tobacco. She reports current alcohol use of about 4.0 standard drinks of alcohol per week. She reports that she does not use drugs. 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 Lives with her partner.   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 100 g 0   escitalopram (LEXAPRO) 10 MG tablet Take 1 tablet (10 mg total) by mouth daily. 90 tablet 3   estradiol (VIVELLE-DOT) 0.025 MG/24HR Place 1 patch onto the skin 2 (two) times a week. 24 patch 3   Fexofenadine HCl (ALLEGRA ALLERGY PO) Take by mouth daily.     IBU 600 MG tablet      progesterone (PROMETRIUM) 100 MG capsule Take 1 capsule (100 mg total) by mouth daily. 90 capsule 3   triamcinolone cream (KENALOG) 0.1 % Apply 1 application topically 2 (two) times daily.  30 g 0   valACYclovir (VALTREX) 1000 MG tablet TAKE 2 TABLETS TWICE A DAY q12 hours x 2 prn cold sore. 30 tablet 1   No current facility-administered medications for this visit.    Family History  Problem Relation Age of Onset   Coronary artery disease Mother 69       CABG, died 15 due to MI   Aneurysm Mother        behind eye   Hypertension Father    Coronary artery disease Father        CABG   Kidney failure Father    Coronary artery disease Sister 38       MI, 9 stents   Hyperlipidemia Sister    Crohn's disease Sister 26   Polymyalgia rheumatica Sister        PMR   Colon cancer Maternal Grandfather 31    Review of Systems  All other systems reviewed and are negative.   Exam:   BP 118/80 (BP Location: Right Arm, Patient Position: Sitting, Cuff Size: Normal)   Pulse 80   Ht 5\' 5"  (1.651 m)   Wt 162 lb (73.5 kg)   LMP 04/12/2011   BMI 26.96 kg/m  Weight change: @WEIGHTCHANGE @ Height:   Height: 5\' 5"  (165.1 cm)  Ht Readings from Last 3 Encounters:  02/20/22 5\' 5"  (1.651 m)  11/13/21 5\' 6"  (1.676 m)  02/14/21 5\' 6"  (1.676 m)    General appearance: alert, cooperative and appears stated age Head: Normocephalic, without obvious abnormality, atraumatic Neck: no adenopathy, supple, symmetrical, trachea midline and thyroid normal to inspection and palpation Lungs: clear to auscultation bilaterally Cardiovascular: regular rate and rhythm Breasts: normal appearance, no masses or tenderness Abdomen: soft, non-tender; non distended,  no masses,  no organomegaly Extremities: extremities normal, atraumatic, no cyanosis or edema Skin: Skin color, texture, turgor normal. No rashes or lesions Lymph nodes: Cervical, supraclavicular, and axillary nodes normal. No abnormal inguinal nodes palpated Neurologic: Grossly normal   Pelvic: External genitalia:  no lesions              Urethra:  normal appearing urethra with no masses, tenderness or lesions              Bartholins and  Skenes: normal                 Vagina: normal appearing vagina with normal color and discharge, no lesions              Cervix: no lesions               Bimanual Exam:  Uterus:  normal size, contour, position, consistency, mobility, non-tender              Adnexa: no mass, fullness, tenderness               Rectovaginal: Confirms               Anus:  normal sphincter tone, no lesions  , CMA chaperoned for the exam.  1. Well woman exam Discussed breast self exam Discussed calcium and vit D intake Mammogram due, she will schedule it Labs with primary Colonoscopy UTD No pap this year  2. Hormone replacement therapy (HRT) Doing well, discussed risks - estradiol (VIVELLE-DOT) 0.025 MG/24HR; Place 1 patch onto the skin 2 (two) times a week.  Dispense: 24 patch; Refill: 3 - progesterone (PROMETRIUM) 100 MG capsule; Take 1 capsule (100 mg total) by mouth daily.  Dispense: 90 capsule; Refill: 3  3. H/O cold sores - valACYclovir (VALTREX) 1000 MG tablet; TAKE 2 TABLETS TWICE A DAY q12 hours x 2 prn cold sore.  Dispense: 30 tablet; Refill: 1

## 2022-02-15 DIAGNOSIS — Z79890 Hormone replacement therapy (postmenopausal): Secondary | ICD-10-CM | POA: Diagnosis not present

## 2022-02-20 ENCOUNTER — Encounter: Payer: Self-pay | Admitting: Obstetrics and Gynecology

## 2022-02-20 ENCOUNTER — Ambulatory Visit (INDEPENDENT_AMBULATORY_CARE_PROVIDER_SITE_OTHER): Payer: Federal, State, Local not specified - PPO | Admitting: Obstetrics and Gynecology

## 2022-02-20 VITALS — BP 118/80 | HR 80 | Ht 65.0 in | Wt 162.0 lb

## 2022-02-20 DIAGNOSIS — Z79890 Hormone replacement therapy (postmenopausal): Secondary | ICD-10-CM

## 2022-02-20 DIAGNOSIS — Z8619 Personal history of other infectious and parasitic diseases: Secondary | ICD-10-CM | POA: Diagnosis not present

## 2022-02-20 DIAGNOSIS — Z01419 Encounter for gynecological examination (general) (routine) without abnormal findings: Secondary | ICD-10-CM

## 2022-02-20 MED ORDER — PROGESTERONE MICRONIZED 100 MG PO CAPS: 100.0000 mg | ORAL_CAPSULE | Freq: Every day | ORAL | 3 refills | Status: DC

## 2022-02-20 MED ORDER — ESTRADIOL 0.025 MG/24HR TD PTTW: 1.0000 | MEDICATED_PATCH | TRANSDERMAL | 3 refills | Status: DC

## 2022-02-20 MED ORDER — VALACYCLOVIR HCL 1 G PO TABS: ORAL_TABLET | ORAL | 1 refills | Status: DC

## 2022-02-20 NOTE — Patient Instructions (Signed)

## 2022-03-12 DIAGNOSIS — M24812 Other specific joint derangements of left shoulder, not elsewhere classified: Secondary | ICD-10-CM | POA: Diagnosis not present

## 2022-03-12 DIAGNOSIS — M67912 Unspecified disorder of synovium and tendon, left shoulder: Secondary | ICD-10-CM | POA: Diagnosis not present

## 2022-04-01 ENCOUNTER — Ambulatory Visit
Admission: RE | Admit: 2022-04-01 | Discharge: 2022-04-01 | Disposition: A | Discharge status: Home / Self Care | Payer: Federal, State, Local not specified - PPO | Source: Ambulatory Visit | Attending: Obstetrics and Gynecology | Admitting: Obstetrics and Gynecology

## 2022-04-01 ENCOUNTER — Other Ambulatory Visit: Payer: Self-pay | Admitting: Obstetrics and Gynecology

## 2022-04-01 DIAGNOSIS — Z1231 Encounter for screening mammogram for malignant neoplasm of breast: Secondary | ICD-10-CM

## 2022-12-23 DIAGNOSIS — D2262 Melanocytic nevi of left upper limb, including shoulder: Secondary | ICD-10-CM | POA: Diagnosis not present

## 2022-12-23 DIAGNOSIS — B360 Pityriasis versicolor: Secondary | ICD-10-CM | POA: Diagnosis not present

## 2022-12-23 DIAGNOSIS — L821 Other seborrheic keratosis: Secondary | ICD-10-CM | POA: Diagnosis not present

## 2022-12-23 DIAGNOSIS — D225 Melanocytic nevi of trunk: Secondary | ICD-10-CM | POA: Diagnosis not present

## 2022-12-26 ENCOUNTER — Other Ambulatory Visit (HOSPITAL_COMMUNITY): Payer: Self-pay | Admitting: Gastroenterology

## 2022-12-26 ENCOUNTER — Ambulatory Visit (HOSPITAL_COMMUNITY)
Admission: RE | Admit: 2022-12-26 | Discharge: 2022-12-26 | Disposition: A | Discharge status: Home / Self Care | Payer: Federal, State, Local not specified - PPO | Source: Ambulatory Visit | Attending: Gastroenterology | Admitting: Gastroenterology

## 2022-12-26 DIAGNOSIS — R1032 Left lower quadrant pain: Secondary | ICD-10-CM | POA: Diagnosis not present

## 2022-12-26 DIAGNOSIS — K5732 Diverticulitis of large intestine without perforation or abscess without bleeding: Secondary | ICD-10-CM | POA: Diagnosis not present

## 2022-12-26 DIAGNOSIS — K5730 Diverticulosis of large intestine without perforation or abscess without bleeding: Secondary | ICD-10-CM | POA: Diagnosis not present

## 2022-12-26 DIAGNOSIS — K449 Diaphragmatic hernia without obstruction or gangrene: Secondary | ICD-10-CM | POA: Diagnosis not present

## 2022-12-26 DIAGNOSIS — K219 Gastro-esophageal reflux disease without esophagitis: Secondary | ICD-10-CM | POA: Diagnosis not present

## 2022-12-26 DIAGNOSIS — K5792 Diverticulitis of intestine, part unspecified, without perforation or abscess without bleeding: Secondary | ICD-10-CM | POA: Diagnosis not present

## 2022-12-26 MED ORDER — IOHEXOL 300 MG/ML  SOLN
100.0000 mL | Freq: Once | INTRAMUSCULAR | Status: AC | PRN
  Administered 2022-12-26: 100 mL via INTRAVENOUS

## 2022-12-26 MED ORDER — SODIUM CHLORIDE (PF) 0.9 % IJ SOLN
INTRAMUSCULAR | Status: AC
  Filled 2022-12-26: qty 50

## 2023-02-10 ENCOUNTER — Other Ambulatory Visit: Payer: Self-pay | Admitting: Internal Medicine

## 2023-02-10 NOTE — Telephone Encounter (Signed)
Patient needs this refill sent in - [place] is going [place] the [place] tomorrow - she made appointment for 12/16

## 2023-02-11 ENCOUNTER — Other Ambulatory Visit: Payer: Self-pay | Admitting: Internal Medicine

## 2023-02-21 ENCOUNTER — Telehealth: Payer: Self-pay | Admitting: Internal Medicine

## 2023-02-21 NOTE — Telephone Encounter (Signed)
Patient is overdue for a physical has not been seen

## 2023-02-21 NOTE — Telephone Encounter (Signed)
Patient has requested a refill of escitalopram (LEXAPRO) 10 MG tablet . She has scheduled an appointment on 03/17/2023, but is out of medication. She would like to know if she can get a refill to last until that appointment. Otherwise, she would like a call back at [phone].

## 2023-02-24 ENCOUNTER — Encounter: Payer: Federal, State, Local not specified - PPO | Admitting: Internal Medicine

## 2023-02-25 ENCOUNTER — Other Ambulatory Visit: Payer: Self-pay

## 2023-02-25 MED ORDER — ESCITALOPRAM OXALATE 10 MG PO TABS: 10.0000 mg | ORAL_TABLET | Freq: Every day | ORAL | 0 refills | Status: DC

## 2023-02-25 NOTE — Telephone Encounter (Signed)
I have sent I a 30 day for the patient but she must keep upcoming appointment

## 2023-02-25 NOTE — Telephone Encounter (Signed)
Pt is wondering if she need to complete her appointment FIRST in order to receive her refill? Please advise, Thanks  CB# [phone]  Patient has requested a refill of escitalopram (LEXAPRO) 10 MG tablet . She has scheduled an appointment on 03/17/2023, but is out of medication.

## 2023-03-17 ENCOUNTER — Encounter: Payer: Self-pay | Admitting: Internal Medicine

## 2023-03-17 ENCOUNTER — Ambulatory Visit (INDEPENDENT_AMBULATORY_CARE_PROVIDER_SITE_OTHER): Payer: Federal, State, Local not specified - PPO | Admitting: Internal Medicine

## 2023-03-17 VITALS — BP 116/80 | HR 70 | Temp 98.0°F | Ht 65.0 in | Wt 164.0 lb

## 2023-03-17 DIAGNOSIS — E782 Mixed hyperlipidemia: Secondary | ICD-10-CM | POA: Diagnosis not present

## 2023-03-17 DIAGNOSIS — Z0001 Encounter for general adult medical examination with abnormal findings: Secondary | ICD-10-CM | POA: Diagnosis not present

## 2023-03-17 DIAGNOSIS — Z0000 Encounter for general adult medical examination without abnormal findings: Secondary | ICD-10-CM

## 2023-03-17 DIAGNOSIS — F3341 Major depressive disorder, recurrent, in partial remission: Secondary | ICD-10-CM

## 2023-03-17 DIAGNOSIS — J069 Acute upper respiratory infection, unspecified: Secondary | ICD-10-CM | POA: Insufficient documentation

## 2023-03-17 HISTORY — DX: Acute upper respiratory infection, unspecified: J06.9

## 2023-03-17 LAB — COMPREHENSIVE METABOLIC PANEL
ALT: 17 U/L (ref 0–35)
AST: 17 U/L (ref 0–37)
Albumin: 4.4 g/dL (ref 3.5–5.2)
Alkaline Phosphatase: 89 U/L (ref 39–117)
BUN: 16 mg/dL (ref 6–23)
CO2: 28 meq/L (ref 19–32)
Calcium: 9.8 mg/dL (ref 8.4–10.5)
Chloride: 103 meq/L (ref 96–112)
Creatinine, Ser: 0.79 mg/dL (ref 0.40–1.20)
GFR: 80.87 mL/min (ref >60.00)
Glucose, Bld: 89 mg/dL (ref 70–99)
Potassium: 3.8 meq/L (ref 3.5–5.1)
Sodium: 138 meq/L (ref 135–145)
Total Bilirubin: 0.3 mg/dL (ref 0.2–1.2)
Total Protein: 7.7 g/dL (ref 6.0–8.3)

## 2023-03-17 LAB — CBC
HCT: 39.2 % (ref 36.0–46.0)
Hemoglobin: 13.2 g/dL (ref 12.0–15.0)
MCHC: 33.6 g/dL (ref 30.0–36.0)
MCV: 93.3 fL (ref 78.0–100.0)
Platelets: 375 10*3/uL (ref 150.0–400.0)
RBC: 4.2 Mil/uL (ref 3.87–5.11)
RDW: 13.7 % (ref 11.5–15.5)
WBC: 9 10*3/uL (ref 4.0–10.5)

## 2023-03-17 LAB — LIPID PANEL
Cholesterol: 248 mg/dL — ABNORMAL HIGH (ref 0–200)
HDL: 39.6 mg/dL (ref >39.00)
LDL Cholesterol: 151 mg/dL — ABNORMAL HIGH (ref 0–99)
NonHDL: 208.12
Total CHOL/HDL Ratio: 6
Triglycerides: 285 mg/dL — ABNORMAL HIGH (ref 0.0–149.0)
VLDL: 57 mg/dL — ABNORMAL HIGH (ref 0.0–40.0)

## 2023-03-17 MED ORDER — TRIAMCINOLONE ACETONIDE 0.1 % EX CREA: 1.0000 | TOPICAL_CREAM | Freq: Two times a day (BID) | CUTANEOUS | 1 refills | Status: AC

## 2023-03-17 MED ORDER — ESCITALOPRAM OXALATE 20 MG PO TABS: 20.0000 mg | ORAL_TABLET | Freq: Every day | ORAL | 3 refills | Status: AC

## 2023-03-17 MED ORDER — DICLOFENAC SODIUM 1 % EX GEL: 2.0000 g | Freq: Four times a day (QID) | CUTANEOUS | 0 refills | Status: DC

## 2023-03-17 NOTE — Assessment & Plan Note (Signed)
 Has seasonal worsening pattern. Will increase lexapro to 20 mg daily and take 20 mg during winter months and then 10 mg daily rest of year. New rx done.

## 2023-03-17 NOTE — Assessment & Plan Note (Signed)
 Suspect flu given prominence in community. Discussed typical course 7-10 days. Lungs are clear and symptoms are not worsening without signs of infection on exam. She is not taking her allegra lately advised to resume and is using otc at night time for cough/sleep okay to continue. No antibiotics appropriate. Advised once recovered if she has not gotten to get flu vaccine.

## 2023-03-17 NOTE — Progress Notes (Signed)
   Subjective:   Patient ID: April Vaughn, female    DOB: [birth year], 63 y.o.   MRN: [id number]  HPI The patient is here for physical but also having acute issues she wants addressed with recent cold type symptoms. She took some leftover antibiotics she had which maybe helped some. She is having SOB and congestion and sore throat for 7 days.  PMH, [facility], social history reviewed and updated  Review of Systems  Constitutional:  Positive for activity change. Negative for appetite change, chills, fatigue, fever and unexpected weight change.  HENT:  Positive for congestion, postnasal drip, rhinorrhea and sinus pressure. Negative for ear discharge, ear pain, sinus pain, sneezing, sore throat, tinnitus, trouble swallowing and voice change.   Eyes: Negative.   Respiratory:  Positive for cough. Negative for chest tightness, shortness of breath and wheezing.   Cardiovascular: Negative.   Gastrointestinal: Negative.   Musculoskeletal:  Positive for myalgias.  Neurological: Negative.     Objective:  Physical Exam Constitutional:      Appearance: She is well-developed.  HENT:     Head: Normocephalic and atraumatic.     Comments: Oropharynx with redness and clear drainage, nose with swollen turbinates, TMs normal bilaterally.  Neck:     Thyroid : No thyromegaly.  Cardiovascular:     Rate and Rhythm: Normal rate and regular rhythm.  Pulmonary:     Effort: Pulmonary effort is normal. No respiratory distress.     Breath sounds: Normal breath sounds. No wheezing or rales.  Abdominal:     General: Bowel sounds are normal. There is no distension.     Palpations: Abdomen is soft.     Tenderness: There is no abdominal tenderness. There is no rebound.  Musculoskeletal:        General: No tenderness.     Cervical back: Normal range of motion.  Lymphadenopathy:     Cervical: No cervical adenopathy.  Skin:    General: Skin is warm and dry.  Neurological:     Mental Status: She is alert and  oriented to person, place, and time.     Coordination: Coordination normal.     Vitals:   03/17/23 1440  BP: 116/80  Pulse: 70  Temp: 98 F (36.7 C)  TempSrc: Oral  SpO2: 98%  Weight: 164 lb (74.4 kg)  Height: 5' 5 (1.651 m)    Assessment & Plan:

## 2023-03-17 NOTE — Assessment & Plan Note (Signed)
 Checking lipid panel and adjust as needed.

## 2023-03-17 NOTE — Assessment & Plan Note (Signed)
 Flu shot sick today declines. Shingrix  complete. Tetanus declines today. Colonoscopy up to date. Mammogram up to date, pap smear up to date. Counseled about sun safety and mole surveillance. Counseled about the dangers of distracted driving. Given 10 year screening recommendations.

## 2023-03-18 ENCOUNTER — Encounter: Payer: Self-pay | Admitting: Internal Medicine

## 2023-03-18 ENCOUNTER — Encounter: Payer: Self-pay | Admitting: Obstetrics and Gynecology

## 2023-03-18 DIAGNOSIS — Z79890 Hormone replacement therapy (postmenopausal): Secondary | ICD-10-CM

## 2023-03-19 MED ORDER — PROGESTERONE MICRONIZED 100 MG PO CAPS: 100.0000 mg | ORAL_CAPSULE | Freq: Every day | ORAL | 0 refills | Status: DC

## 2023-03-19 NOTE — Telephone Encounter (Signed)
 Med refill request:Progesterone 100 mg PO daily Last AEX: 02/20/22 -JJ Next AEX: 03/27/23 -GH Last MMG (if hormonal med) 04/01/22 -BiRads 1 neg Refill authorized: Please Advise?

## 2023-03-27 ENCOUNTER — Other Ambulatory Visit (HOSPITAL_COMMUNITY)
Admission: RE | Admit: 2023-03-27 | Discharge: 2023-03-27 | Disposition: A | Discharge status: Home / Self Care | Payer: Federal, State, Local not specified - PPO | Source: Ambulatory Visit | Attending: Obstetrics and Gynecology | Admitting: Obstetrics and Gynecology

## 2023-03-27 ENCOUNTER — Encounter: Payer: Self-pay | Admitting: Obstetrics and Gynecology

## 2023-03-27 ENCOUNTER — Ambulatory Visit: Payer: Federal, State, Local not specified - PPO | Admitting: Obstetrics and Gynecology

## 2023-03-27 VITALS — BP 112/68 | HR 71 | Ht 65.5 in | Wt 160.0 lb

## 2023-03-27 DIAGNOSIS — Z79890 Hormone replacement therapy (postmenopausal): Secondary | ICD-10-CM | POA: Diagnosis not present

## 2023-03-27 DIAGNOSIS — Z1151 Encounter for screening for human papillomavirus (HPV): Secondary | ICD-10-CM | POA: Diagnosis not present

## 2023-03-27 DIAGNOSIS — Z01419 Encounter for gynecological examination (general) (routine) without abnormal findings: Secondary | ICD-10-CM

## 2023-03-27 DIAGNOSIS — Z124 Encounter for screening for malignant neoplasm of cervix: Secondary | ICD-10-CM | POA: Diagnosis not present

## 2023-03-27 MED ORDER — ESTRADIOL 0.025 MG/24HR TD PTTW: MEDICATED_PATCH | TRANSDERMAL | 3 refills | Status: DC

## 2023-03-27 MED ORDER — PROGESTERONE MICRONIZED 100 MG PO CAPS: 100.0000 mg | ORAL_CAPSULE | Freq: Every day | ORAL | 3 refills | Status: DC

## 2023-03-27 NOTE — Progress Notes (Signed)
63 y.o. G0P0000 postmenopausal female on low-dose HRT, history of oral HSV when we decide here for annual exam. Single.  Postmenopausal bleeding: none Pelvic discharge or pain: none Breast mass, nipple discharge or skin changes : none Last PAP:     Component Value Date/Time   DIAGPAP  09/22/2018 0000    NEGATIVE FOR INTRAEPITHELIAL LESIONS OR MALIGNANCY.   DIAGPAP  09/10/2016 0000    NEGATIVE FOR INTRAEPITHELIAL LESIONS OR MALIGNANCY.   ADEQPAP  09/22/2018 0000    Satisfactory for evaluation  endocervical/transformation zone component ABSENT.   ADEQPAP  09/10/2016 0000    Satisfactory for evaluation  endocervical/transformation zone component PRESENT.   Last mammogram: 04/01/2022 BI-RADS 1, density B Last colonoscopy: 08/21/2020 positive polyp, follow-up in 5 years Last DXA: 02/12/2022, T-score -1.2 Sexually active: Yes, female partner Exercising: Yes. 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 100 g 0   escitalopram (LEXAPRO) 20 MG tablet Take 1 tablet (20 mg total) by mouth daily. 90 tablet 3   Fexofenadine HCl (ALLEGRA ALLERGY PO) Take by mouth daily.     IBU 600 MG tablet      triamcinolone cream (KENALOG) 0.1 % Apply 1 Application topically 2 (two) times daily. 100 g 1   valACYclovir (VALTREX) 1000 MG tablet TAKE 2 TABLETS  TWICE A DAY q12 hours x 2 prn cold sore. 30 tablet 1   No current facility-administered medications on file prior to visit.    Social History   Socioeconomic History   Marital status: Single    Spouse name: Not on file   Number of children: Not on file   Years of education: Not on file   Highest education level: Not on file  Occupational History   Not on file  Tobacco Use   Smoking status: Never   Smokeless tobacco: Never  Substance and Sexual Activity   Alcohol use: Yes    Alcohol/week: 4.0 standard drinks of alcohol    Types: 4 Standard drinks or equivalent per week   Drug use: No   Sexual activity: Yes    Partners: Female    Birth control/protection: None  Other Topics Concern   Not on file  Social History Narrative   Works as Database administrator.  Lives with significant other female partner.   Social Drivers of Corporate investment banker Strain: Not on file  Food Insecurity: Not on file  Transportation Needs: Not on file  Physical Activity: Not on file  Stress: Not on file  Social Connections: Not on file  Intimate Partner Violence: Not on file    Family History  Problem Relation Age of Onset   Coronary artery  disease Mother 79       CABG, died 82 due to MI   Aneurysm Mother        behind eye   Hypertension Father    Coronary artery disease Father        CABG   Kidney failure Father    Coronary artery disease Sister 74       MI, 9 stents   Hyperlipidemia Sister    Crohn's disease Sister 71   Polymyalgia rheumatica Sister        PMR   Heart attack Brother    Colon cancer Maternal Grandfather 37    No Known Allergies    PE Today's Vitals   03/27/23 1350  BP: 112/68  Pulse: 71  SpO2: 97%  Weight: 160 lb (72.6 kg)  Height: 5' 5.5" (1.664 m)   Body mass index is 26.22 kg/m.  Physical Exam Vitals reviewed. Exam conducted with a chaperone present.  Constitutional:      General: She is not in acute distress.    Appearance: Normal  appearance.  HENT:     Head: Normocephalic and atraumatic.     Nose: Nose normal.  Eyes:     Extraocular Movements: Extraocular movements intact.     Conjunctiva/sclera: Conjunctivae normal.  Neck:     Thyroid: No thyroid mass, thyromegaly or thyroid tenderness.  Pulmonary:     Effort: Pulmonary effort is normal.  Chest:     Chest wall: No mass or tenderness.  Breasts:    Right: Normal. No swelling, mass, nipple discharge, skin change or tenderness.     Left: Normal. No swelling, mass, nipple discharge, skin change or tenderness.  Abdominal:     General: There is no distension.     Palpations: Abdomen is soft.     Tenderness: There is no abdominal tenderness.  Genitourinary:    General: Normal vulva.     Exam position: Lithotomy position.     Urethra: No prolapse.     Vagina: Normal. No vaginal discharge or bleeding.     Cervix: Normal. No lesion.     Uterus: Normal. Not enlarged and not tender.      Adnexa: Right adnexa normal and left adnexa normal.     Comments: Some discomfort with pediatric speculum Musculoskeletal:        General: Normal range of motion.     Cervical back: Normal range of motion.  Lymphadenopathy:     Upper Body:     Right upper body: No axillary adenopathy.     Left upper body: No axillary adenopathy.     Lower Body: No right inguinal adenopathy. No left inguinal adenopathy.  Skin:    General: Skin is warm and dry.  Neurological:     General: No focal deficit present.     Mental Status: She is alert.  Psychiatric:        Mood and Affect: Mood normal.        Behavior: Behavior normal.       Assessment and Plan:        Well woman exam with routine gynecological exam Assessment & Plan: Cervical cancer screening performed according to ASCCP guidelines. Encouraged annual mammogram screening Colonoscopy UTD DXA UTD Labs and immunizations with her primary Encouraged safe sexual practices as indicated Encouraged healthy lifestyle practices  with diet and exercise For patients under 50-70yo, I recommend 1200mg  calcium daily and 600IU of vitamin D daily.    Cervical cancer screening -  Cytology - PAP  Hormone replacement therapy (HRT) Assessment & Plan: 10-year cardiovascular risk 4.1%. Reviewed risk of cardiovascular disease including MI and stroke, DVT, and breast cancer with patient and patient is over 74.  However patient would like to continue therapy.  She is currently using half patch of the lowest dose of estrogen, which is appropriate. She is a candidate for extended use to age 37 years old given low cardiovascular risk. Continue to reassess. Encouraged healthy lifestyle.  Orders: -     Estradiol; Place 1/2 patch on skin twice weekly.  Dispense: 12 patch; Refill: 3 -     Progesterone; Take 1 capsule (100 mg total) by mouth daily.  Dispense: 90 capsule; Refill: 3    Rosalyn Gess, MD

## 2023-03-27 NOTE — Assessment & Plan Note (Signed)
Cervical cancer screening performed according to ASCCP guidelines. Encouraged annual mammogram screening Colonoscopy UTD DXA UTD Labs and immunizations with her primary Encouraged safe sexual practices as indicated Encouraged healthy lifestyle practices with diet and exercise For patients under 50-62yo, I recommend 1200mg  calcium daily and 600IU of vitamin D daily.

## 2023-03-27 NOTE — Assessment & Plan Note (Signed)
10-year cardiovascular risk 4.1%. Reviewed risk of cardiovascular disease including MI and stroke, DVT, and breast cancer with patient and patient is over 79.  However patient would like to continue therapy.  She is currently using half patch of the lowest dose of estrogen, which is appropriate. She is a candidate for extended use to age 63 years old given low cardiovascular risk. Continue to reassess. Encouraged healthy lifestyle.

## 2023-03-27 NOTE — Patient Instructions (Signed)

## 2023-03-28 LAB — CYTOLOGY - PAP
Adequacy: ABSENT
Comment: NEGATIVE
Diagnosis: NEGATIVE
High risk HPV: NEGATIVE

## 2023-03-31 ENCOUNTER — Encounter: Payer: Self-pay | Admitting: Obstetrics and Gynecology

## 2023-05-09 DIAGNOSIS — R051 Acute cough: Secondary | ICD-10-CM | POA: Diagnosis not present

## 2023-05-09 DIAGNOSIS — Z20822 Contact with and (suspected) exposure to covid-19: Secondary | ICD-10-CM | POA: Diagnosis not present

## 2023-05-09 DIAGNOSIS — J019 Acute sinusitis, unspecified: Secondary | ICD-10-CM | POA: Diagnosis not present

## 2023-05-09 DIAGNOSIS — B9689 Other specified bacterial agents as the cause of diseases classified elsewhere: Secondary | ICD-10-CM | POA: Diagnosis not present

## 2023-05-09 DIAGNOSIS — R509 Fever, unspecified: Secondary | ICD-10-CM | POA: Diagnosis not present

## 2023-07-15 DIAGNOSIS — K080 Exfoliation of teeth due to systemic causes: Secondary | ICD-10-CM | POA: Diagnosis not present

## 2023-11-13 DIAGNOSIS — Z20822 Contact with and (suspected) exposure to covid-19: Secondary | ICD-10-CM | POA: Diagnosis not present

## 2023-11-13 DIAGNOSIS — R051 Acute cough: Secondary | ICD-10-CM | POA: Diagnosis not present

## 2023-11-13 DIAGNOSIS — J40 Bronchitis, not specified as acute or chronic: Secondary | ICD-10-CM | POA: Diagnosis not present

## 2023-11-19 DIAGNOSIS — M65312 Trigger thumb, left thumb: Secondary | ICD-10-CM | POA: Diagnosis not present

## 2023-12-25 DIAGNOSIS — D225 Melanocytic nevi of trunk: Secondary | ICD-10-CM | POA: Diagnosis not present

## 2023-12-25 DIAGNOSIS — D2262 Melanocytic nevi of left upper limb, including shoulder: Secondary | ICD-10-CM | POA: Diagnosis not present

## 2023-12-25 DIAGNOSIS — D2261 Melanocytic nevi of right upper limb, including shoulder: Secondary | ICD-10-CM | POA: Diagnosis not present

## 2023-12-25 DIAGNOSIS — L814 Other melanin hyperpigmentation: Secondary | ICD-10-CM | POA: Diagnosis not present

## 2024-02-09 ENCOUNTER — Encounter: Payer: Self-pay | Admitting: Internal Medicine

## 2024-02-09 NOTE — Progress Notes (Unsigned)
    Subjective:    Patient ID: April Vaughn, female    DOB: Jan 04, 1961, 63 y.o.   MRN: [id number]      HPI April Vaughn is here for No chief complaint on file.        Medications and allergies reviewed with patient and updated if appropriate.  Current Outpatient Medications on File Prior to Visit  Medication Sig Dispense Refill   diclofenac  Sodium (VOLTAREN ) 1 % GEL Apply 2 g topically 4 (four) times daily. 100 g 0   escitalopram  (LEXAPRO ) 20 MG tablet Take 1 tablet (20 mg total) by mouth daily. 90 tablet 3   estradiol  (VIVELLE -DOT) 0.025 MG/24HR Place 1/2 patch on skin twice weekly. 12 patch 3   Fexofenadine HCl (ALLEGRA ALLERGY  PO) Take by mouth daily.     IBU 600 MG tablet      progesterone  (PROMETRIUM ) 100 MG capsule Take 1 capsule (100 mg total) by mouth daily. 90 capsule 3   triamcinolone  cream (KENALOG ) 0.1 % Apply 1 Application topically 2 (two) times daily. 100 g 1   valACYclovir  (VALTREX ) 1000 MG tablet TAKE 2 TABLETS TWICE A DAY q12 hours x 2 prn cold sore. 30 tablet 1   No current facility-administered medications on file prior to visit.    Review of Systems     Objective:  There were no vitals filed for this visit. BP Readings from Last 3 Encounters:  03/27/23 112/68  03/17/23 116/80  02/20/22 118/80   Wt Readings from Last 3 Encounters:  03/27/23 160 lb (72.6 kg)  03/17/23 164 lb (74.4 kg)  02/20/22 162 lb (73.5 kg)   There is no height or weight on file to calculate BMI.    Physical Exam         Assessment & Plan:    See Problem List for Assessment and Plan of chronic medical problems.

## 2024-02-09 NOTE — Telephone Encounter (Signed)
 Tried to call pt but went to VM. Per DPR left detailed message to call office to schedule appt. Advised pt that if new or worsening sx occur, ie SOB continues, or if heartburn does not seem to go away, not to wait for appt to be seen in office, go directly to the ER. Otherwise, you may call office to schedule.

## 2024-02-10 ENCOUNTER — Ambulatory Visit: Admitting: Internal Medicine

## 2024-02-10 VITALS — BP 110/70 | HR 74 | Temp 98.9°F | Ht 65.5 in | Wt 161.0 lb

## 2024-02-10 DIAGNOSIS — J029 Acute pharyngitis, unspecified: Secondary | ICD-10-CM | POA: Diagnosis not present

## 2024-02-10 DIAGNOSIS — J209 Acute bronchitis, unspecified: Secondary | ICD-10-CM | POA: Diagnosis not present

## 2024-02-10 MED ORDER — HYDROCODONE BIT-HOMATROP MBR 5-1.5 MG/5ML PO SOLN: 5.0000 mL | Freq: Three times a day (TID) | ORAL | 0 refills | Status: AC | PRN

## 2024-02-10 MED ORDER — AMOXICILLIN-POT CLAVULANATE 875-125 MG PO TABS: 1.0000 | ORAL_TABLET | Freq: Two times a day (BID) | ORAL | 0 refills | Status: AC

## 2024-02-10 NOTE — Patient Instructions (Addendum)
     Medications changes include :   augmentin , hycodan cough syrup      Return if symptoms worsen or fail to improve.

## 2024-02-11 LAB — POC INFLUENZA A&B (BINAX/QUICKVUE)
Influenza A, POC: NEGATIVE
Influenza B, POC: NEGATIVE

## 2024-02-11 NOTE — Addendum Note (Signed)
 Addended by: CLAUDENE TOBIAS PARAS on: 02/11/2024 11:28 AM   Modules accepted: Orders

## 2024-03-25 ENCOUNTER — Encounter: Payer: Self-pay | Admitting: Obstetrics and Gynecology

## 2024-03-25 DIAGNOSIS — Z1231 Encounter for screening mammogram for malignant neoplasm of breast: Secondary | ICD-10-CM

## 2024-03-29 ENCOUNTER — Ambulatory Visit: Payer: Federal, State, Local not specified - PPO | Admitting: Obstetrics and Gynecology

## 2024-04-08 ENCOUNTER — Encounter: Payer: Self-pay | Admitting: Obstetrics and Gynecology

## 2024-04-08 ENCOUNTER — Ambulatory Visit: Admitting: Obstetrics and Gynecology

## 2024-04-08 VITALS — BP 104/62 | HR 71 | Temp 98.6°F | Ht 66.25 in | Wt 159.0 lb

## 2024-04-08 DIAGNOSIS — Z1331 Encounter for screening for depression: Secondary | ICD-10-CM

## 2024-04-08 DIAGNOSIS — Z01419 Encounter for gynecological examination (general) (routine) without abnormal findings: Secondary | ICD-10-CM | POA: Diagnosis not present

## 2024-04-08 DIAGNOSIS — Z79890 Hormone replacement therapy (postmenopausal): Secondary | ICD-10-CM | POA: Diagnosis not present

## 2024-04-08 DIAGNOSIS — Z8619 Personal history of other infectious and parasitic diseases: Secondary | ICD-10-CM | POA: Diagnosis not present

## 2024-04-08 MED ORDER — VALACYCLOVIR HCL 1 G PO TABS: ORAL_TABLET | ORAL | 1 refills | Status: AC

## 2024-04-08 MED ORDER — PROGESTERONE MICRONIZED 100 MG PO CAPS: 100.0000 mg | ORAL_CAPSULE | Freq: Every day | ORAL | 0 refills | Status: AC

## 2024-04-08 MED ORDER — ESTRADIOL 0.025 MG/24HR TD PTTW: MEDICATED_PATCH | TRANSDERMAL | 0 refills | Status: AC

## 2024-04-08 NOTE — Assessment & Plan Note (Signed)
 Cervical cancer screening performed according to ASCCP guidelines. Encouraged annual mammogram screening Colonoscopy UTD DXA UTD Labs and immunizations with her primary Encouraged safe sexual practices as indicated Encouraged healthy lifestyle practices with diet and exercise For patients under 50-63yo, I recommend 1200mg  calcium daily and 600IU of vitamin D daily.

## 2024-04-08 NOTE — Patient Instructions (Signed)
 For patients under 50-63yo, I recommend 1200mg  calcium  daily and 600IU of vitamin D daily. For patients over 63yo, I recommend 1200mg  calcium  daily and 800IU of vitamin D daily.  Health Maintenance, Female Adopting a healthy lifestyle and getting preventive care are important in promoting health and wellness. Ask your health care provider about: The right schedule for you to have regular tests and exams. Things you can do on your own to prevent diseases and keep yourself healthy. What should I know about diet, weight, and exercise? Eat a healthy diet  Eat a diet that includes plenty of vegetables, fruits, low-fat dairy products, and lean protein. Do not eat a lot of foods that are high in solid fats, added sugars, or sodium. Maintain a healthy weight Body mass index (BMI) is used to identify weight problems. It estimates body fat based on height and weight. Your health care provider can help determine your BMI and help you achieve or maintain a healthy weight. Get regular exercise Get regular exercise. This is one of the most important things you can do for your health. Most adults should: Exercise for at least 150 minutes each week. The exercise should increase your heart rate and make you sweat (moderate-intensity exercise). Do strengthening exercises at least twice a week. This is in addition to the moderate-intensity exercise. Spend less time sitting. Even light physical activity can be beneficial. Watch cholesterol and blood lipids Have your blood tested for lipids and cholesterol at 63 years of age, then have this test every 5 years. Have your cholesterol levels checked more often if: Your lipid or cholesterol levels are high. You are older than 63 years of age. You are at high risk for heart disease. What should I know about cancer screening? Depending on your health history and family history, you may need to have cancer screening at various ages. This may include screening  for: Breast cancer. Cervical cancer. Colorectal cancer. Skin cancer. Lung cancer. What should I know about heart disease, diabetes, and high blood pressure? Blood pressure and heart disease High blood pressure causes heart disease and increases the risk of stroke. This is more likely to develop in people who have high blood pressure readings or are overweight. Have your blood pressure checked: Every 3-5 years if you are 25-57 years of age. Every year if you are 24 years old or older. Diabetes Have regular diabetes screenings. This checks your fasting blood sugar level. Have the screening done: Once every three years after age 62 if you are at a normal weight and have a low risk for diabetes. More often and at a younger age if you are overweight or have a high risk for diabetes. What should I know about preventing infection? Hepatitis B If you have a higher risk for hepatitis B, you should be screened for this virus. Talk with your health care provider to find out if you are at risk for hepatitis B infection. Hepatitis C Testing is recommended for: Everyone born from [birth year] through 1965. Anyone with known risk factors for hepatitis C. Sexually transmitted infections (STIs) Get screened for STIs, including gonorrhea and chlamydia, if: You are sexually active and are younger than 63 years of age. You are older than 63 years of age and your health care provider tells you that you are at risk for this type of infection. Your sexual activity has changed since you were last screened, and you are at increased risk for chlamydia or gonorrhea. Ask your health care provider if  you are at risk. Ask your health care provider about whether you are at high risk for HIV. Your health care provider may recommend a prescription medicine to help prevent HIV infection. If you choose to take medicine to prevent HIV, you should first get tested for HIV. You should then be tested every 3 months for as long as you  are taking the medicine. Osteoporosis and menopause Osteoporosis is a disease in which the bones lose minerals and strength with aging. This can result in bone fractures. If you are 72 years old or older, or if you are at risk for osteoporosis and fractures, ask your health care provider if you should: Be screened for bone loss. Take a calcium  or vitamin D supplement to lower your risk of fractures. Be given hormone replacement therapy (HRT) to treat symptoms of menopause. Follow these instructions at home: Alcohol use Do not drink alcohol if: Your health care provider tells you not to drink. You are pregnant, may be pregnant, or are planning to become pregnant. If you drink alcohol: Limit how much you have to: 0-1 drink a day. Know how much alcohol is in your drink. In the [place] one drink equals one 12 oz bottle of beer (355 mL), one 5 oz glass of wine (148 mL), or one 1 oz glass of hard liquor (44 mL). Lifestyle Do not use any products that contain nicotine or tobacco. These products include cigarettes, chewing tobacco, and vaping devices, such as e-cigarettes. If you need help quitting, ask your health care provider. Do not use street drugs. Do not share needles. Ask your health care provider for help if you need support or information about quitting drugs. General instructions Schedule regular health, dental, and eye exams. Stay current with your vaccines. Tell your health care provider if: You often feel depressed. You have ever been abused or do not feel safe at home. Summary Adopting a healthy lifestyle and getting preventive care are important in promoting health and wellness. Follow your health care provider's instructions about healthy diet, exercising, and getting tested or screened for diseases. Follow your health care provider's instructions on monitoring your cholesterol and blood pressure. This information is not intended to replace advice given to you by your health  care provider. Make sure you discuss any questions you have with your health care provider. Document Revised: 07/17/2020 Document Reviewed: 07/17/2020 Elsevier Patient Education  2024 [employer].

## 2024-04-08 NOTE — Progress Notes (Signed)
 "  64 y.o. G0P0000 postmenopausal female on low-dose HRT, history of oral HSV, mild osteopenia here for annual exam. Single. Programmer, [employer]. PCP: Rollene Almarie LABOR, MD  She reports no concerns today.  She wants to discuss risk vs benefits of continuing hormone replacement therapy.  Postmenopausal bleeding: none Pelvic discharge or pain: none Breast mass, nipple discharge or skin changes : none  Last PAP:     Component Value Date/Time   DIAGPAP  03/27/2023 1418    - Negative for intraepithelial lesion or malignancy (NILM)   DIAGPAP  09/22/2018 0000    NEGATIVE FOR INTRAEPITHELIAL LESIONS OR MALIGNANCY.   DIAGPAP  09/10/2016 0000    NEGATIVE FOR INTRAEPITHELIAL LESIONS OR MALIGNANCY.   HPVHIGH Negative 03/27/2023 1418   ADEQPAP  03/27/2023 1418    Satisfactory for evaluation; transformation zone component ABSENT.   ADEQPAP  09/22/2018 0000    Satisfactory for evaluation  endocervical/transformation zone component ABSENT.   ADEQPAP  09/10/2016 0000    Satisfactory for evaluation  endocervical/transformation zone component PRESENT.   Last mammogram: 03/31/24 [facility] 1 (CE) Last colonoscopy: 08/21/2020 positive polyp, follow-up in 5 years Last DXA: 02/12/2022, T-score -1.2  Sexually active: Yes, female partner Exercising: Yes. 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 90 tablet 3   Fexofenadine HCl (ALLEGRA ALLERGY  PO) Take by mouth daily.     HYDROcodone  bit-homatropine (HYCODAN) 5-1.5 MG/5ML syrup Take 5 mLs by mouth every 8 (eight) hours as needed for cough. 120 mL 0   IBU 600 MG tablet      triamcinolone  cream (KENALOG ) 0.1 % Apply 1 Application topically 2 (two) times daily. 100 g 1   No current facility-administered medications on file prior to visit.    Social History   Socioeconomic History   Marital status: Single    Spouse name: Not on file   Number of children: Not on file   Years of education: Not on file   Highest education level: Not on file  Occupational History   Not on file  Tobacco Use   Smoking status: Never   Smokeless tobacco: Never  Substance and Sexual Activity   Alcohol use: Yes    Alcohol/week: 4.0 standard drinks of alcohol    Types: 4 Standard drinks or equivalent per week   Drug use: No   Sexual activity: Yes    Partners: Female    Birth control/protection: None  Other Topics Concern   Not on file  Social History Narrative   Works as Database administrator.  Lives with significant other female partner.   Social Drivers of Health   Tobacco Use: Low Risk (04/08/2024)   Patient History    Smoking Tobacco Use: Never    Smokeless Tobacco Use: Never    Passive Exposure: Not on file  Financial Resource Strain: Not on file  Food Insecurity: Not on file  Transportation Needs: Not on file  Physical Activity: Not on file  Stress: Not on file  Social Connections: Not on file  Intimate Partner Violence: Not on file  Depression (PHQ2-9): Low Risk (04/08/2024)   Depression (PHQ2-9)    PHQ-2 Score: 0  Alcohol Screen: Not on file   Housing: Not on file  Utilities: Not on file  Health Literacy: Not on file    Family History  Problem Relation Age of Onset   Coronary artery disease Mother 79       CABG, died 42 due to MI   Aneurysm Mother        behind eye   Hypertension Father    Coronary artery disease Father        CABG   Kidney failure Father    Coronary artery disease Sister 51       MI, 9 stents   Hyperlipidemia Sister    Crohn's disease Sister 78   Polymyalgia rheumatica Sister        PMR   Heart attack Brother    Colon cancer Maternal Grandfather 38    No Known Allergies    PE Today's Vitals   04/08/24 1338  BP: 104/62  Pulse: 71  Temp: 98.6 F (37 C)  TempSrc: Oral  SpO2: 97%  Weight: 159 lb (72.1 kg)  Height: 5' 6.25 (1.683 m)    Body mass index is 25.47 kg/m.  Physical Exam Vitals reviewed. Exam conducted with a chaperone present.  Constitutional:      General: She is not in acute distress.    Appearance: Normal appearance.  HENT:     Head: Normocephalic and atraumatic.     Nose: Nose normal.  Eyes:     Extraocular Movements: Extraocular movements intact.     Conjunctiva/sclera: Conjunctivae normal.  Pulmonary:     Effort: Pulmonary effort is normal.  Chest:     Chest wall: No mass or tenderness.  Breasts:    Right: Normal. No swelling, mass, nipple discharge, skin change or tenderness.     Left: Normal. No swelling, mass, nipple discharge, skin change or tenderness.  Abdominal:     General: There is no distension.     Palpations: Abdomen is soft.     Tenderness: There is no abdominal tenderness.  Genitourinary:    General: Normal vulva.     Exam position: Lithotomy position.     Urethra: No prolapse.     Vagina: Normal. No vaginal discharge or bleeding.     Cervix: Normal. No lesion.     Uterus: Normal. Not enlarged and not tender.      Adnexa: Right adnexa normal and left adnexa normal.     Comments: Some discomfort with pediatric speculum Musculoskeletal:         General: Normal range of motion.  Lymphadenopathy:     Upper Body:     Right upper body: No axillary adenopathy.     Left upper body: No axillary adenopathy.     Lower Body: No right inguinal adenopathy. No  left inguinal adenopathy.  Skin:    General: Skin is warm and dry.  Neurological:     General: No focal deficit present.     Mental Status: She is alert.  Psychiatric:        Mood and Affect: Mood normal.        Behavior: Behavior normal.      Assessment and Plan:        Well woman exam with routine gynecological exam Assessment & Plan: Cervical cancer screening performed according to ASCCP guidelines. Encouraged annual mammogram screening Colonoscopy UTD DXA UTD Labs and immunizations with her primary Encouraged safe sexual practices as indicated Encouraged healthy lifestyle practices with diet and exercise For patients under 50-70yo, I recommend 1200mg  calcium daily and 600IU of vitamin D  daily.   Hormone replacement therapy (HRT) Recommend discontinuing in patient over 60yo. Will taper off patch over 3 months -     Estradiol ; Place 1/2 patch on skin weekly for 12 weeks.  Dispense: 6 patch; Refill: 0 -     Progesterone ; Take 1 capsule (100 mg total) by mouth daily.  Dispense: 90 capsule; Refill: 0  H/O cold sores -     valACYclovir  HCl; TAKE 2 TABLETS TWICE A DAY q12 hours x 2 prn cold sore.  Dispense: 30 tablet; Refill: 1  Negative depression screening  Vera LULLA Pa, MD  "
